# Patient Record
Sex: Female | Born: 1965 | Race: White | Hispanic: No | Marital: Married | Smoking: Former smoker
Health system: Southern US, Community
[De-identification: ages and names within clinical notes are randomized; demographics above are authoritative.]

## PROBLEM LIST (undated history)

## (undated) DIAGNOSIS — I341 Nonrheumatic mitral (valve) prolapse: Secondary | ICD-10-CM

## (undated) DIAGNOSIS — K219 Gastro-esophageal reflux disease without esophagitis: Secondary | ICD-10-CM

## (undated) DIAGNOSIS — J45909 Unspecified asthma, uncomplicated: Secondary | ICD-10-CM

## (undated) DIAGNOSIS — L309 Dermatitis, unspecified: Secondary | ICD-10-CM

## (undated) DIAGNOSIS — E039 Hypothyroidism, unspecified: Secondary | ICD-10-CM

## (undated) DIAGNOSIS — T4145XA Adverse effect of unspecified anesthetic, initial encounter: Secondary | ICD-10-CM

## (undated) DIAGNOSIS — T8859XA Other complications of anesthesia, initial encounter: Secondary | ICD-10-CM

## (undated) DIAGNOSIS — C50919 Malignant neoplasm of unspecified site of unspecified female breast: Secondary | ICD-10-CM

## (undated) DIAGNOSIS — F419 Anxiety disorder, unspecified: Secondary | ICD-10-CM

## (undated) DIAGNOSIS — M1990 Unspecified osteoarthritis, unspecified site: Secondary | ICD-10-CM

## (undated) HISTORY — PX: WISDOM TOOTH EXTRACTION: SHX21

## (undated) HISTORY — DX: Unspecified asthma, uncomplicated: J45.909

## (undated) HISTORY — DX: Dermatitis, unspecified: L30.9

## (undated) HISTORY — PX: COLONOSCOPY: SHX174

---

## 1999-08-23 ENCOUNTER — Encounter: Payer: Self-pay | Admitting: Emergency Medicine

## 1999-08-23 ENCOUNTER — Emergency Department (HOSPITAL_COMMUNITY): Admission: EM | Admit: 1999-08-23 | Discharge: 1999-08-23 | Payer: Self-pay | Admitting: Emergency Medicine

## 2016-09-08 ENCOUNTER — Other Ambulatory Visit: Payer: Self-pay | Admitting: Obstetrics and Gynecology

## 2016-09-08 ENCOUNTER — Other Ambulatory Visit: Payer: Self-pay | Admitting: General Practice

## 2016-09-08 DIAGNOSIS — N63 Unspecified lump in unspecified breast: Secondary | ICD-10-CM

## 2016-09-14 ENCOUNTER — Other Ambulatory Visit: Payer: Self-pay | Admitting: Obstetrics and Gynecology

## 2016-09-14 DIAGNOSIS — N63 Unspecified lump in unspecified breast: Secondary | ICD-10-CM

## 2016-09-15 ENCOUNTER — Ambulatory Visit
Admission: RE | Admit: 2016-09-15 | Discharge: 2016-09-15 | Disposition: A | Discharge status: Home / Self Care | Payer: Managed Care, Other (non HMO) | Source: Ambulatory Visit | Attending: Obstetrics and Gynecology | Admitting: Obstetrics and Gynecology

## 2016-09-15 ENCOUNTER — Ambulatory Visit
Admission: RE | Admit: 2016-09-15 | Discharge: 2016-09-15 | Disposition: A | Discharge status: Home / Self Care | Payer: Managed Care, Other (non HMO) | Source: Ambulatory Visit | Attending: Family Medicine | Admitting: Family Medicine

## 2016-09-15 DIAGNOSIS — C50919 Malignant neoplasm of unspecified site of unspecified female breast: Secondary | ICD-10-CM

## 2016-09-15 DIAGNOSIS — N63 Unspecified lump in unspecified breast: Secondary | ICD-10-CM

## 2016-09-15 HISTORY — DX: Malignant neoplasm of unspecified site of unspecified female breast: C50.919

## 2016-09-30 ENCOUNTER — Telehealth: Payer: Self-pay | Admitting: Hematology and Oncology

## 2016-09-30 ENCOUNTER — Other Ambulatory Visit: Payer: Self-pay | Admitting: General Surgery

## 2016-09-30 ENCOUNTER — Encounter: Payer: Self-pay | Admitting: Hematology and Oncology

## 2016-09-30 DIAGNOSIS — R928 Other abnormal and inconclusive findings on diagnostic imaging of breast: Secondary | ICD-10-CM

## 2016-09-30 NOTE — Telephone Encounter (Signed)
Appt scheduled w/[facility] for 11/27 at 345pm. Pt aware to arrive 30 minutes early. Demographics verified. Letter mailed to the pt.

## 2016-10-18 ENCOUNTER — Ambulatory Visit (HOSPITAL_BASED_OUTPATIENT_CLINIC_OR_DEPARTMENT_OTHER): Payer: Managed Care, Other (non HMO) | Admitting: Hematology and Oncology

## 2016-10-18 ENCOUNTER — Telehealth: Payer: Self-pay | Admitting: Hematology and Oncology

## 2016-10-18 ENCOUNTER — Encounter: Payer: Self-pay | Admitting: Hematology and Oncology

## 2016-10-18 DIAGNOSIS — N6091 Unspecified benign mammary dysplasia of right breast: Secondary | ICD-10-CM | POA: Diagnosis not present

## 2016-10-18 MED ORDER — TAMOXIFEN CITRATE 20 MG PO TABS: 20.0000 mg | ORAL_TABLET | Freq: Every day | ORAL | 3 refills | Status: DC

## 2016-10-18 NOTE — Progress Notes (Signed)
[facility] CONSULT NOTE  Patient Care Team: Drake Leach, MD as PCP - General (Internal Medicine)  CHIEF COMPLAINTS/PURPOSE OF CONSULTATION:  Newly diagnosed atypical lobular hyperplasia  HISTORY OF PRESENTING ILLNESS:  Patty Thompson 50 y.o. female is here because of recent diagnosis of right breast atypical lobular hyperplasia involving a complex sclerosing lesion. Patient had a mammogram at [facility] that detected an abnormality under 3-D views. She was then referred to the breast center where she had a biopsy under 3-D guidance. Pathology returned back as atypical lobular hyperplasia involving a complex sclerosing lesion. She was seen by Dr. Barry Dienes who is scheduling her for a lumpectomy surgery. She is here today to discuss treatment options to reduce the risk of breast cancer.  I reviewed her records extensively and collaborated the history with the patient.  In terms of breast cancer risk profile:  She menarched at early age of 50 and is perimenopausal   She had 2 pregnancy, her first child was born at age 50  She was never exposed to fertility medications or hormone replacement therapy.  She has  family history of Breast/GYN/GI cancer Her first cousin was diagnosed breast cancer age 43  MEDICAL HISTORY: Hypothyroidism, diverticulitis, tendinitis, insomnia, depression   SURGICAL HISTORY:None   SOCIAL HISTORY: Denies any tobacco or recreational drug use. Occasional alcohol use maybe less than once a month.  FAMILY HISTORY:Paternal cousin breast cancer age 49, great aunt on the maternal side unknown cancer   ALLERGIES: Sulfa causes hives swelling and fever  MEDICATIONS:  Current Outpatient Prescriptions  Medication Sig Dispense Refill  . tamoxifen (NOLVADEX) 20 MG tablet Take 1 tablet (20 mg total) by mouth daily. 90 tablet 3   No current facility-administered medications for this visit.   Levothyroxine 125 mg daily, Paxil 10 mg daily, clonazepam as needed  at bedtime, Zyrtec as needed daily  REVIEW OF SYSTEMS:   Constitutional: Denies fevers, chills or abnormal night sweats Eyes: Denies blurriness of vision, double vision or watery eyes Ears, nose, mouth, throat, and face: Denies mucositis or sore throat Respiratory: Denies cough, dyspnea or wheezes Cardiovascular: Denies palpitation, chest discomfort or lower extremity swelling Gastrointestinal:  Denies nausea, heartburn or change in bowel habits Skin: Denies abnormal skin rashes Lymphatics: Denies new lymphadenopathy or easy bruising Neurological:Denies numbness, tingling or new weaknesses Behavioral/Psych: Mood is stable, no new changes  Breast:  Denies any palpable lumps or discharge All other systems were reviewed with the patient and are negative.  PHYSICAL EXAMINATION: ECOG PERFORMANCE STATUS: 0 - Asymptomatic  Vitals:   10/18/16 1553  BP: (!) 126/46  Pulse: (!) 59  Resp: 14  Temp: 97.9 F (36.6 C)   Filed Weights   10/18/16 1553  Weight: 159 lb (72.1 kg)    GENERAL:alert, no distress and comfortable SKIN: skin color, texture, turgor are normal, no rashes or significant lesions EYES: normal, conjunctiva are pink and non-injected, sclera clear OROPHARYNX:no exudate, no erythema and lips, buccal mucosa, and tongue normal  NECK: supple, thyroid normal size, non-tender, without nodularity LYMPH:  no palpable lymphadenopathy in the cervical, axillary or inguinal LUNGS: clear to auscultation and percussion with normal breathing effort HEART: regular rate & rhythm and no murmurs and no lower extremity edema ABDOMEN:abdomen soft, non-tender and normal bowel sounds Musculoskeletal:no cyanosis of digits and no clubbing  PSYCH: alert & oriented x 3 with fluent speech NEURO: no focal motor/sensory deficits BREAST: No palpable nodules in breast. No palpable axillary or supraclavicular lymphadenopathy (exam performed in  the presence of a chaperone)   RADIOGRAPHIC STUDIES: I have  personally reviewed the radiological reports and agreed with the findings in the report.  ASSESSMENT AND PLAN:  Atypical lobular hyperplasia (ALH) of right breast Right breast biopsy upper inner quadrant 09/15/2016: Atypical lobular hyperplasia involving a complex sclerosing lesion  Pathology review: ALH confers a substantially increased risk of subsequent breast cancer. Studies revealed that the relative risk can be 3.72 0.3 times an average woman. It can increase risk of both seem side as well as contralateral breast cancer.  Risk reduction strategies with ALH: 1. Continued surveillance with annual mammograms and breast exams 2. women with [place] should not take oral contraceptives and should also avoid hormone replacement therapy 3. Lifestyle and dietary changes to help stay active and lose weight. Diet containing less red meat would also be recommended. 4. Primary prevention with tamoxifen may be considered for breast cancer risk reduction   Prognosis: Based on [place], patient's lifestyle risk of breast cancer is 26% while an average risk is 11.4%  Patient's 5 year risk of developing breast cancer is 3.2% but an average risk is 1.2%  Tamoxifen counseling:We discussed the risks and benefits of tamoxifen. These include but not limited to insomnia, hot flashes, mood changes, vaginal dryness, and weight gain. Although rare, serious side effects including endometrial cancer, risk of blood clots were also discussed. We strongly believe that the benefits far outweigh the risks. Patient understands these risks and consented to starting treatment. Planned treatment duration is 5 years.   I discussed with the patient what interaction between Paxil and Wellbutrin with tamoxifen. Patient is planning on tapering and discontinuing Paxil and Wellbutrin. She will discuss with her primary care physician if Effexor XR 37.5 mg is an appropriate substitution. Patient would like to start the medication today.  She is likely to have breast surgery in January with Dr. Barry Dienes.  Return to clinic in 3 months for toxicity check and follow-up. She is tolerating it very well we can see her once every 6 months for a year and then annually thereafter.  All questions were answered. The patient knows to call the clinic with any problems, questions or concerns.    Rulon Eisenmenger, MD 10/18/16

## 2016-10-18 NOTE — Assessment & Plan Note (Signed)
Right breast biopsy upper inner quadrant 09/15/2016: Atypical lobular hyperplasia involving a complex sclerosing lesion  Pathology review: ALH confers a substantially increased risk of subsequent breast cancer. Studies revealed that the relative risk can be 3.72 0.3 times an average woman. It can increase risk of both seem side as well as contralateral breast cancer.  Risk reduction strategies with ALH: 1. Continued surveillance with annual mammograms and breast exams 2. women with [place] should not take oral contraceptives and should also avoid hormone replacement therapy 3. Lifestyle and dietary changes to help stay active and lose weight. Diet containing less red meat would also be recommended. 4. Primary prevention with tamoxifen may be considered for breast cancer risk reduction   Prognosis: Based on [place], patient's lifestyle risk of breast cancer is  Patient's 5 year risk of developing breast cancer is

## 2016-10-18 NOTE — Telephone Encounter (Signed)
Gave patient avs report and appointments for February.  °

## 2016-10-19 NOTE — Progress Notes (Signed)
Location of Breast Cancer: Right Breast  Upper Inner Quadrant  Histology per Pathology Report: Diagnosis 09/15/2016: Breast, right, needle core biopsy, upper inner quadrant - LOBULAR NEOPLASIA (ATYPICAL LOBULAR HYPERPLASIA) INVOLVING A COMPLEX SCLEROSING LESION  Receptor Status: ER(), PR (), Her2-neu (), Ki-()  Did patient present with symptoms (if so, please note symptoms) or was this found on screening mammography?:   Past/Anticipated interventions by surgeon, if any:Dr. Stark Klein, MD11/05/2016, seen, surgery scheduled 11/25/2016  Past/Anticipated interventions by medical oncology, if any: Chemotherapy : Dr. Lindi Adie 10/18/16, tamoxifen ,   3 months,toxicity check and follow up 01/17/17  Lymphedema issues, if any:  No  Pain issues, if any:   Soreness  Right breast where biopsy done  SAFETY ISSUES:  Prior radiation? NO  Pacemaker/ICD? NO  Possible current pregnancy? NO  Is the patient on methotrexate?  NO  Current Complaints / other details:   Menarche age 52,G2P2 1st live birth age 83, NO HRT, no tobacco use, occasional alcohol 1st  Paternal cousin dx breast cancer age 10, Maternal  Great aunt  Believe breast cancer  BP (!) 88/54 (BP Location: Left Arm, Patient Position: Sitting, Cuff Size: Normal)   Pulse 64   Temp 98.1 F (36.7 C)   Wt Readings from Last 3 Encounters:  10/18/16 159 lb (72.1 kg)    Allergies: Sulfa =hives and fever    Rebecca Eaton, RN 10/19/2016,9:43 AM

## 2016-10-19 NOTE — Progress Notes (Signed)
Location of Breast Cancer: Right Breast  Upper Inner Quadrant  Histology per Pathology Report: Diagnosis 09/15/2016: Breast, right, needle core biopsy, upper inner quadrant - LOBULAR NEOPLASIA (ATYPICAL LOBULAR HYPERPLASIA) INVOLVING A COMPLEX SCLEROSING LESION  Receptor Status: ER(), PR (), Her2-neu (), Ki-()  Did patient present with symptoms (if so, please note symptoms) or was this found on screening mammography?:   Past/Anticipated interventions by surgeon, if any: Dr. Barry Dienes  Has scheduled lumpectomy 11/25/2016  Past/Anticipated interventions by medical oncology, if any: Chemotherapy : Dr. Lindi Adie 10/18/16   Lymphedema issues, if any:    Pain issues, if any:    SAFETY ISSUES:  Prior radiation? NO  Pacemaker/ICD? NO  Possible current pregnancy?  Is the patient on methotrexate?  NO  Current Complaints / other details:   Menarche age 59,G2P2 1st live birth age 22, NO HRT, no tobacco use, occasional alcohol 1st cousin dx breast cancer age 4,  Allergies: Sulfa =hives and fever, tape    [name], Felicita Gage, RN 10/19/2016,10:32 AM

## 2016-10-21 ENCOUNTER — Ambulatory Visit
Admission: RE | Admit: 2016-10-21 | Discharge: 2016-10-21 | Disposition: A | Discharge status: Home / Self Care | Payer: Managed Care, Other (non HMO) | Source: Ambulatory Visit | Attending: Radiation Oncology | Admitting: Radiation Oncology

## 2016-10-21 ENCOUNTER — Encounter: Payer: Self-pay | Admitting: Radiation Oncology

## 2016-10-21 VITALS — BP 88/54 | HR 64 | Temp 98.1°F

## 2016-10-21 DIAGNOSIS — Z79899 Other long term (current) drug therapy: Secondary | ICD-10-CM | POA: Diagnosis not present

## 2016-10-21 DIAGNOSIS — Z882 Allergy status to sulfonamides status: Secondary | ICD-10-CM | POA: Diagnosis not present

## 2016-10-21 DIAGNOSIS — C50211 Malignant neoplasm of upper-inner quadrant of right female breast: Secondary | ICD-10-CM

## 2016-10-21 DIAGNOSIS — Z79810 Long term (current) use of selective estrogen receptor modulators (SERMs): Secondary | ICD-10-CM | POA: Diagnosis not present

## 2016-10-21 DIAGNOSIS — N6081 Other benign mammary dysplasias of right breast: Secondary | ICD-10-CM | POA: Insufficient documentation

## 2016-10-21 DIAGNOSIS — N6091 Unspecified benign mammary dysplasia of right breast: Secondary | ICD-10-CM

## 2016-10-21 HISTORY — DX: Malignant neoplasm of unspecified site of unspecified female breast: C50.919

## 2016-10-21 NOTE — Progress Notes (Signed)
Radiation Oncology         [phone] ________________________________  Name: Patty Thompson MRN: [id number]  Date: 10/21/2016  DOB: 09-Jul-1966  [id number], Meda Coffee, MD  Stark Klein, MD     REFERRING PHYSICIAN: Stark Klein, MD   DIAGNOSIS: The primary encounter diagnosis was Malignant neoplasm of upper-inner quadrant of right female breast, unspecified estrogen receptor status ([place]). A diagnosis of Atypical lobular hyperplasia [facility]) of right breast was also pertinent to this visit.  CHIEF COMPLAINT: Recent diagnosis of atypical lobular neoplasia involving a complex sclerosing lesion of the right breast  HISTORY OF PRESENT ILLNESS: Patty Thompson is a 50 y.o. female who had a screening mammogram on 08/30/16 in [place], [place] that detected a possible distortion in the right breast.  Diagnostic mammogram of the right breast on 09/08/16 noted a persistent architectural distortion with probable associated microcalcifications in the slightly medial posterior right breast.  Biopsy of the UIQ of the right breast on 09/15/16 revealed lobular neoplasia (atypical lobular hyperplasia) involving a complex sclerosing lesion.  The patient consulted with Dr. Barry Dienes on 09/28/16 regarding surgery. The patient then consulted with Dr. Lindi Adie on 10/18/16 who discussed that she has a higher risk of developing breast cancer. He recommended starting neoadjuvant Tamoxifen and surgery at a later date. The patient presents today to discuss the role of radiation in the management of her disease.  PREVIOUS RADIATION THERAPY: No  PAST MEDICAL HISTORY:  Past Medical History:  Diagnosis Date  . Breast cancer ([place]) 09/15/2016   Right breast upper inner       PAST SURGICAL HISTORY:No past surgical history on file.   FAMILY HISTORY: No family history on file.   SOCIAL HISTORY:  reports that she has never smoked. She has never used smokeless tobacco.   ALLERGIES: Sulfa antibiotics and  Tape   MEDICATIONS:  Current Outpatient Prescriptions  Medication Sig Dispense Refill  . tamoxifen (NOLVADEX) 20 MG tablet Take 1 tablet (20 mg total) by mouth daily. 90 tablet 3  . cetirizine (ZYRTEC) 10 MG tablet Take 10 mg by mouth daily.    . Levothyroxine Sodium (SYNTHROID PO) Take 135 mcg by mouth daily.    Marland Kitchen LORazepam (ATIVAN) 0.5 MG tablet Take 0.5 mg by mouth at bedtime as needed for anxiety.    Marland Kitchen PARoxetine (PAXIL) 10 MG tablet Take 10 mg by mouth daily.     No current facility-administered medications for this encounter.      REVIEW OF SYSTEMS: Negative other than As discussed in the history of present illness     PHYSICAL EXAM:  Wt Readings from Last 3 Encounters:  10/18/16 159 lb (72.1 kg)   Temp Readings from Last 3 Encounters:  10/21/16 98.1 F (36.7 C)  10/18/16 97.9 F (36.6 C) (Oral)   BP Readings from Last 3 Encounters:  10/21/16 (!) 88/54  10/18/16 (!) 126/46   Pulse Readings from Last 3 Encounters:  10/21/16 64  10/18/16 (!) 59      ECOG = 1  0 - Asymptomatic (Fully active, able to carry on all predisease activities without restriction)  1 - Symptomatic but completely ambulatory (Restricted in physically strenuous activity but ambulatory and able to carry out work of a light or sedentary nature. For example, light housework, office work)  2 - Symptomatic, <50% in bed during the day (Ambulatory and capable of all self care but unable to carry out any work activities. Up and about more than 50% of waking hours)  3 -  Symptomatic, >50% in bed, but not bedbound (Capable of only limited self-care, confined to bed or chair 50% or more of waking hours)  4 - Bedbound (Completely disabled. Cannot carry on any self-care. Totally confined to bed or chair)  5 - Death   Eustace Pen MM, Creech RH, Tormey DC, et al. [phone]). "Toxicity and response criteria of the [facility]". [place] Oncol. 5 (6): 649-55    LABORATORY DATA:  No  results found for: WBC, HGB, HCT, MCV, PLT No results found for: NA, K, CL, CO2 No results found for: ALT, AST, GGT, ALKPHOS, BILITOT    RADIOGRAPHY: No results found.     IMPRESSION: Lobular neoplasia involving a complex sclerosing lesion.  In regards to her current diagnosis, surgery and hormonal therapy alone is adequate in terms of treatment. If by chance final pathology reveals additional findings, then radiation might be warranted.  Even though radiation might not be required, we discussed the role of radiation in decreasing local failures in patients who undergo lumpectomy in the setting of breast cancer. We discussed the process of CT simulation and the placement tattoos. We discussed 4-6 weeks of treatment as an outpatient. We discussed the possibility of asymptomatic lung damage. We discussed the low likelihood of secondary malignancies. We discussed the possible side effects including but not limited to skin redness, fatigue, permanent skin darkening, and breast swelling.  PLAN: The patient is scheduled for a right lumpectomy by Dr. Barry Dienes in January 2018. She is also scheduled to follow up with Dr. Lindi Adie in regards to her Tamoxifen in February 2018. The patient will see me on a PRN basis.   The patient was seen today for 20 minutes, with the majority of the time spent counseling the patient on his diagnosis of cancer and coordinating his care.  ------------------------------------------------  Jodelle Gross, MD, PhD  This document serves as a record of services personally performed by Kyung Rudd, MD. It was created on his behalf by Darcus Austin, a trained medical scribe. The creation of this record is based on the scribe's personal observations and the provider's statements to them. This document has been checked and approved by the attending provider.

## 2016-11-18 NOTE — Pre-Procedure Instructions (Signed)
    ASUSENA BOGGESS  11/18/2016      [id number] - [place], [place] - [address]. AT [address] [place] [address] Phone: [phone] Fax: [phone]    Your procedure is scheduled on Thursday, January 4.  Report to [facility] Admitting at 5:30 AM               Your surgery or procedure is scheduled for 7:30 AM   Call this number if you have problems the morning of surgery: [phone]               For any other questions, please call [phone], Monday - Friday 8 AM - 4 PM.    Remember:  Do not eat food or drink liquids after midnight Wednesday, January 3.  Take these medicines the morning of surgery with A SIP OF WATER: levothyroxine (SYNTHROID, LEVOTHROID), tamoxifen (NOLVADEX) .                     Take if needed: acetaminophen (TYLENOL).                Stop taking Aspirin, Aspirin Products herbal medications.  Do not take any NSAIDS ie:  Ibuprofen, Advil, Naproxen.   Do not wear jewelry, make-up or nail polish.  Do not wear lotions, powders, or perfumes, or deodorant.  Do not shave 48 hours prior to surgery.   Do not bring valuables to the hospital.  [facility] is not responsible for any belongings or valuables.  Contacts, dentures or bridgework may not be worn into surgery.  Leave your suitcase in the car.  After surgery it may be brought to your room.  For patients admitted to the hospital, discharge time will be determined by your treatment team.  Patients discharged the day of surgery will not be allowed to drive home.   Name and phone number of your driver:  -  Special instructions: Review  [facility] - Preparing For Surgery.  Please read over the following fact sheets that you were given: Review  [facility] - Preparing For Surgery, Coughing and Deep Breathing and Pain Booklet

## 2016-11-19 ENCOUNTER — Encounter (HOSPITAL_COMMUNITY)
Admission: RE | Admit: 2016-11-19 | Discharge: 2016-11-19 | Disposition: A | Discharge status: Home / Self Care | Payer: Managed Care, Other (non HMO) | Source: Ambulatory Visit | Attending: General Surgery | Admitting: General Surgery

## 2016-11-19 ENCOUNTER — Encounter (HOSPITAL_COMMUNITY): Payer: Self-pay

## 2016-11-19 DIAGNOSIS — Z01812 Encounter for preprocedural laboratory examination: Secondary | ICD-10-CM | POA: Diagnosis present

## 2016-11-19 HISTORY — DX: Adverse effect of unspecified anesthetic, initial encounter: T41.45XA

## 2016-11-19 HISTORY — DX: Hypothyroidism, unspecified: E03.9

## 2016-11-19 HISTORY — DX: Nonrheumatic mitral (valve) prolapse: I34.1

## 2016-11-19 HISTORY — DX: Unspecified osteoarthritis, unspecified site: M19.90

## 2016-11-19 HISTORY — DX: Anxiety disorder, unspecified: F41.9

## 2016-11-19 HISTORY — DX: Other complications of anesthesia, initial encounter: T88.59XA

## 2016-11-19 HISTORY — DX: Gastro-esophageal reflux disease without esophagitis: K21.9

## 2016-11-19 LAB — HCG, SERUM, QUALITATIVE: PREG SERUM: NEGATIVE

## 2016-11-19 LAB — BASIC METABOLIC PANEL
ANION GAP: 10 (ref 5–15)
BUN: 13 mg/dL (ref 6–20)
CALCIUM: 9.5 mg/dL (ref 8.9–10.3)
CO2: 25 mmol/L (ref 22–32)
CREATININE: 0.64 mg/dL (ref 0.44–1.00)
Chloride: 104 mmol/L (ref 101–111)
Glucose, Bld: 65 mg/dL (ref 65–99)
Potassium: 4 mmol/L (ref 3.5–5.1)
SODIUM: 139 mmol/L (ref 135–145)

## 2016-11-19 LAB — CBC
HCT: 43.1 % (ref 36.0–46.0)
HEMOGLOBIN: 14.1 g/dL (ref 12.0–15.0)
MCH: 27.3 pg (ref 26.0–34.0)
MCHC: 32.7 g/dL (ref 30.0–36.0)
MCV: 83.4 fL (ref 78.0–100.0)
PLATELETS: 235 10*3/uL (ref 150–400)
RBC: 5.17 MIL/uL — AB (ref 3.87–5.11)
RDW: 11.8 % (ref 11.5–15.5)
WBC: 5.4 10*3/uL (ref 4.0–10.5)

## 2016-11-19 NOTE — Progress Notes (Signed)
PCP: Dr. Lebron Quam @[facility] [place]--will request notes/any cardiac studies  Pt. States she has not had any cardiac studies, except stress test 20 yrs. Ago---normal  Denies seeing a cardiologist

## 2016-11-24 ENCOUNTER — Ambulatory Visit
Admission: RE | Admit: 2016-11-24 | Discharge: 2016-11-24 | Disposition: A | Discharge status: Home / Self Care | Payer: Managed Care, Other (non HMO) | Source: Ambulatory Visit | Attending: General Surgery | Admitting: General Surgery

## 2016-11-24 DIAGNOSIS — R928 Other abnormal and inconclusive findings on diagnostic imaging of breast: Secondary | ICD-10-CM

## 2016-11-24 NOTE — H&P (Addendum)
Patty Thompson Jefm Petty  Location: [facility] Patient #: [id number] DOB: [birth year] Married / Language: English / Race: White Female   History of Present Illness The patient is a 51 year old female who presents with a complaint of Breast problems. The patient is a 51 year old female referred by Dr. Glennon Mac for consultation with a new diagnosis of abnormal right mammogram. The patient had an area of asymmetry and distortion that was being followed. When this did not resolve, she was recommended to receive a core needle biopsy. Core needle biopsy showed atypical lobular hyperplasia with a complex sclerosing lesion. She was referred for excisional biopsy. The patient does not have for history of breast complaints. She does have a first cousin who had breast cancer. Her diagnostic mammogram is not available as it was performed at [facility]. However reports are reviewed for biopsy and post clip mammogram.  mammogram ADDENDUM: Pathology revealed LOBULAR NEOPLASIA (ATYPICAL LOBULAR HYPERPLASIA) INVOLVING A COMPLEX SCLEROSING LESION of the Right breast, upper inner quadrant. This was found to be concordant by Dr. Luberta Robertson, with excision recommended. Pathology results were discussed with the patient by telephone. The patient reported doing well after the biopsy with tenderness at the site. Post biopsy instructions and care were reviewed and questions were answered. The patient was encouraged to call [facility] for any additional concerns. Surgical consultation has been arranged with Dr. Stark Klein at [facility], per patient request, on September 28, 2016. The coil shaped clip is located approximately 3 cm inferior to the  area of distortion in the lateral projection. Repeat tomosynthesis guided clip placement with seed localization will be needed prior to surgery.   Other Problems Anxiety Disorder  Arthritis  Back Pain   Diverticulosis  Gastroesophageal Reflux Disease  Lump In Breast  Migraine Headache  Thyroid Disease   Past Surgical History Breast Biopsy  Right. Oral Surgery   Diagnostic Studies History Colonoscopy  1-5 years ago Mammogram  within last year Pap Smear  1-5 years ago  Allergies Sulfa Antibiotics  Swelling, Rash.  Medication History Levothyroxine Sodium (137MCG Tablet, Oral daily) Active. Paxil (10MG  Tablet, Oral daily) Active. ClonazePAM (0.5MG  Tablet, 1/4 tablet Oral as needed) Active. ZyrTEC Allergy (10MG  Tablet, Oral daily) Active. Medications Reconciled  Social History Alcohol use  Occasional alcohol use. Caffeine use  Coffee. No drug use  Tobacco use  Former smoker.  Family History Alcohol Abuse  Father, Mother, Sister. Depression  Father, Mother. Diabetes Mellitus  Father. Heart Disease  Father. Hypertension  Father. Respiratory Condition  Son. Thyroid problems  Mother.  Pregnancy / Birth History Gravida  2 Irregular periods  Length (months) of breastfeeding  7-12 Maternal age  64-35 Para  2    Review of Systems General Present- Fatigue. Not Present- Appetite Loss, Chills, Fever, Night Sweats, Weight Gain and Weight Loss. Skin Present- Rash. Not Present- Change in Wart/Mole, Dryness, Hives, Jaundice, New Lesions, Non-Healing Wounds and Ulcer. HEENT Present- Hearing Loss, Ringing in the Ears, Seasonal Allergies, Visual Disturbances and Wears glasses/contact lenses. Not Present- Earache, Hoarseness, Nose Bleed, Oral Ulcers, Sinus Pain, Sore Throat and Yellow Eyes. Breast Present- Breast Mass, Breast Pain and Skin Changes. Not Present- Nipple Discharge. Cardiovascular Present- Swelling of Extremities. Not Present- Chest Pain, Difficulty Breathing Lying Down, Leg Cramps, Palpitations, Rapid Heart Rate and Shortness of Breath. Gastrointestinal Present- Abdominal Pain. Not Present- Bloating, Bloody Stool, Change in Bowel  Habits, Chronic diarrhea, Constipation, Difficulty Swallowing, Excessive gas, Gets full quickly at meals,  Hemorrhoids, Indigestion, Nausea, Rectal Pain and Vomiting. Female Genitourinary Not Present- Frequency, Nocturia, Painful Urination, Pelvic Pain and Urgency. Musculoskeletal Present- Back Pain, Joint Pain and Joint Stiffness. Not Present- Muscle Pain, Muscle Weakness and Swelling of Extremities. Neurological Present- Decreased Memory. Not Present- Fainting, Headaches, Numbness, Seizures, Tingling, Tremor, Trouble walking and Weakness. Psychiatric Present- Anxiety. Not Present- Bipolar, Change in Sleep Pattern, Depression, Fearful and Frequent crying. Endocrine Present- Hair Changes and Hot flashes. Not Present- Cold Intolerance, Excessive Hunger, Heat Intolerance and New Diabetes. Hematology Not Present- Blood Thinners, Easy Bruising, Excessive bleeding, Gland problems, HIV and Persistent Infections.  Vitals  Weight: 157 lb Height: 66.5in Height was reported by patient. Body Surface Area: 1.81 m Body Mass Index: 24.96 kg/m  Temp.: 98.1F  Pulse: 59 (Regular)  BP: 122/62 (Sitting, Left Arm, Standard)    Physical Exam General Mental Status-Alert. General Appearance-Consistent with stated age. Hydration-Well hydrated. Voice-Normal.  Head and Neck Head-normocephalic, atraumatic with no lesions or palpable masses. Trachea-midline. Thyroid Gland Characteristics - normal size and consistency.  Eye Eyeball - Bilateral-Extraocular movements intact. Sclera/Conjunctiva - Bilateral-No scleral icterus.  Chest and Lung Exam Chest and lung exam reveals -quiet, even and easy respiratory effort with no use of accessory muscles and on auscultation, normal breath sounds, no adventitious sounds and normal vocal resonance. Inspection Chest Wall - Normal. Back - normal.  Breast Note: No palpable masses. Some bruising on the upper right breast at the biopsy  site. No nipple retraction or skin dimpling. No nipple discharge is present. no axillary LAD.   Cardiovascular Cardiovascular examination reveals -normal heart sounds, regular rate and rhythm with no murmurs and normal pedal pulses bilaterally.  Abdomen Inspection Inspection of the abdomen reveals - No Hernias. Palpation/Percussion Palpation and Percussion of the abdomen reveal - Soft, Non Tender, No Rebound tenderness, No Rigidity (guarding) and No hepatosplenomegaly. Auscultation Auscultation of the abdomen reveals - Bowel sounds normal.  Neurologic Neurologic evaluation reveals -alert and oriented x 3 with no impairment of recent or remote memory. Mental Status-Normal.  Musculoskeletal Global Assessment -Note: no gross deformities.  Normal Exam - Left-Upper Extremity Strength Normal and Lower Extremity Strength Normal. Normal Exam - Right-Upper Extremity Strength Normal and Lower Extremity Strength Normal.  Lymphatic Head & Neck  General Head & Neck Lymphatics: Bilateral - Description - Normal. Axillary  General Axillary Region: Bilateral - Description - Normal. Tenderness - Non Tender. Femoral & Inguinal  Generalized Femoral & Inguinal Lymphatics: Bilateral - Description - No Generalized lymphadenopathy.    Assessment & Plan ABNORMAL MAMMOGRAM OF RIGHT BREAST (R92.8) Impression: The patient has a biopsy of atypical lobular hyperplasia on core needle biopsy. This is at risk for being adjacent to malignancy. We will perform a CT localized excisional biopsy on this area to make sure that there is not a head and cancer. I'll refer her to oncology for discussion of prophylactic tamoxifen.  I reviewed the risks of surgery as well as the process. I discussed bleeding, infection, seroma, damage to adjacent structures, chronic pain, possible cancer as a diagnosis, possible need for additional surgeries or procedures. Patient understands and wishes to proceed.  The  surgical procedure was described to the patient. I discussed the incision type and location and that we would need radiology involved on with a wire or seed marker and/or sentinel node.  The risks and benefits of the procedure were described to the patient and she wishes to proceed. Current Plans You are being scheduled for surgery - Our schedulers will call you.  You should hear from our office's scheduling department within 5 working days about the location, date, and time of surgery. We try to make accommodations for patient's preferences in scheduling surgery, but sometimes the OR schedule or the surgeon's schedule prevents [place] from making those accommodations.  If you have not heard from our office [phone]) in 5 working days, call the office and ask for your surgeon's nurse.  If you have other questions about your diagnosis, plan, or surgery, call the office and ask for your surgeon's nurse.  Pt Education - CSS Breast Biopsy Instructions (FLB): discussed with patient and provided information.   Signed by Stark Klein, MD

## 2016-11-25 ENCOUNTER — Encounter (HOSPITAL_COMMUNITY): Payer: Self-pay | Admitting: *Deleted

## 2016-11-25 ENCOUNTER — Ambulatory Visit (HOSPITAL_COMMUNITY): Payer: Managed Care, Other (non HMO) | Admitting: Anesthesiology

## 2016-11-25 ENCOUNTER — Ambulatory Visit (HOSPITAL_COMMUNITY)
Admission: RE | Admit: 2016-11-25 | Discharge: 2016-11-25 | Disposition: A | Discharge status: Home / Self Care | Payer: Managed Care, Other (non HMO) | Source: Ambulatory Visit | Attending: General Surgery | Admitting: General Surgery

## 2016-11-25 ENCOUNTER — Encounter (HOSPITAL_COMMUNITY)
Admission: RE | Disposition: A | Discharge status: Home / Self Care | Payer: Self-pay | Source: Ambulatory Visit | Attending: General Surgery

## 2016-11-25 ENCOUNTER — Ambulatory Visit
Admission: RE | Admit: 2016-11-25 | Discharge: 2016-11-25 | Disposition: A | Discharge status: Home / Self Care | Payer: Managed Care, Other (non HMO) | Source: Ambulatory Visit | Attending: General Surgery | Admitting: General Surgery

## 2016-11-25 DIAGNOSIS — N6011 Diffuse cystic mastopathy of right breast: Secondary | ICD-10-CM | POA: Insufficient documentation

## 2016-11-25 DIAGNOSIS — F419 Anxiety disorder, unspecified: Secondary | ICD-10-CM | POA: Diagnosis not present

## 2016-11-25 DIAGNOSIS — E039 Hypothyroidism, unspecified: Secondary | ICD-10-CM | POA: Diagnosis not present

## 2016-11-25 DIAGNOSIS — Z8719 Personal history of other diseases of the digestive system: Secondary | ICD-10-CM | POA: Insufficient documentation

## 2016-11-25 DIAGNOSIS — Z87891 Personal history of nicotine dependence: Secondary | ICD-10-CM | POA: Insufficient documentation

## 2016-11-25 DIAGNOSIS — Z882 Allergy status to sulfonamides status: Secondary | ICD-10-CM | POA: Insufficient documentation

## 2016-11-25 DIAGNOSIS — K219 Gastro-esophageal reflux disease without esophagitis: Secondary | ICD-10-CM | POA: Diagnosis not present

## 2016-11-25 DIAGNOSIS — N61 Mastitis without abscess: Secondary | ICD-10-CM | POA: Insufficient documentation

## 2016-11-25 DIAGNOSIS — M1990 Unspecified osteoarthritis, unspecified site: Secondary | ICD-10-CM | POA: Insufficient documentation

## 2016-11-25 DIAGNOSIS — N6021 Fibroadenosis of right breast: Secondary | ICD-10-CM | POA: Insufficient documentation

## 2016-11-25 DIAGNOSIS — R928 Other abnormal and inconclusive findings on diagnostic imaging of breast: Secondary | ICD-10-CM | POA: Diagnosis present

## 2016-11-25 DIAGNOSIS — I341 Nonrheumatic mitral (valve) prolapse: Secondary | ICD-10-CM | POA: Insufficient documentation

## 2016-11-25 HISTORY — PX: BREAST LUMPECTOMY WITH RADIOACTIVE SEED LOCALIZATION: SHX6424

## 2016-11-25 SURGERY — BREAST LUMPECTOMY WITH RADIOACTIVE SEED LOCALIZATION
Anesthesia: General | Site: Breast | Laterality: Right

## 2016-11-25 MED ORDER — PROPOFOL 10 MG/ML IV BOLUS
INTRAVENOUS | Status: AC
  Filled 2016-11-25: qty 40

## 2016-11-25 MED ORDER — CHLORHEXIDINE GLUCONATE CLOTH 2 % EX PADS: 6.0000 | MEDICATED_PAD | Freq: Once | CUTANEOUS | Status: DC

## 2016-11-25 MED ORDER — MIDAZOLAM HCL 5 MG/5ML IJ SOLN
INTRAMUSCULAR | Status: DC | PRN
  Administered 2016-11-25: 2 mg via INTRAVENOUS

## 2016-11-25 MED ORDER — CEFAZOLIN SODIUM-DEXTROSE 2-4 GM/100ML-% IV SOLN
2.0000 g | INTRAVENOUS | Status: AC
  Administered 2016-11-25: 2 g via INTRAVENOUS
  Filled 2016-11-25: qty 100

## 2016-11-25 MED ORDER — LIDOCAINE 2% (20 MG/ML) 5 ML SYRINGE
INTRAMUSCULAR | Status: AC
  Filled 2016-11-25: qty 5

## 2016-11-25 MED ORDER — ACETAMINOPHEN 650 MG RE SUPP
650.0000 mg | RECTAL | Status: DC | PRN
  Filled 2016-11-25: qty 1

## 2016-11-25 MED ORDER — ONDANSETRON HCL 4 MG/2ML IJ SOLN
INTRAMUSCULAR | Status: DC | PRN
  Administered 2016-11-25: 4 mg via INTRAVENOUS

## 2016-11-25 MED ORDER — SCOPOLAMINE 1 MG/3DAYS TD PT72
MEDICATED_PATCH | TRANSDERMAL | Status: DC | PRN
  Administered 2016-11-25: 1 via TRANSDERMAL

## 2016-11-25 MED ORDER — SCOPOLAMINE 1 MG/3DAYS TD PT72
MEDICATED_PATCH | TRANSDERMAL | Status: AC
  Filled 2016-11-25: qty 2

## 2016-11-25 MED ORDER — EPHEDRINE SULFATE 50 MG/ML IJ SOLN
INTRAMUSCULAR | Status: DC | PRN
  Administered 2016-11-25 (×2): 5 mg via INTRAVENOUS

## 2016-11-25 MED ORDER — SODIUM CHLORIDE 0.9% FLUSH: 3.0000 mL | INTRAVENOUS | Status: DC | PRN

## 2016-11-25 MED ORDER — LACTATED RINGERS IV SOLN
INTRAVENOUS | Status: DC | PRN
  Administered 2016-11-25: 07:00:00 via INTRAVENOUS

## 2016-11-25 MED ORDER — SODIUM CHLORIDE 0.9 % IV SOLN: 250.0000 mL | INTRAVENOUS | Status: DC | PRN

## 2016-11-25 MED ORDER — DEXAMETHASONE SODIUM PHOSPHATE 10 MG/ML IJ SOLN
INTRAMUSCULAR | Status: DC | PRN
  Administered 2016-11-25: 10 mg via INTRAVENOUS

## 2016-11-25 MED ORDER — PROMETHAZINE HCL 25 MG/ML IJ SOLN
INTRAMUSCULAR | Status: DC
Start: 2016-11-25 — End: 2016-11-25
  Filled 2016-11-25: qty 1

## 2016-11-25 MED ORDER — EPHEDRINE 5 MG/ML INJ
INTRAVENOUS | Status: AC
Start: 2016-11-25 — End: 2016-11-25
  Filled 2016-11-25: qty 10

## 2016-11-25 MED ORDER — LIDOCAINE HCL (PF) 1 % IJ SOLN
INTRAMUSCULAR | Status: AC
  Filled 2016-11-25: qty 30

## 2016-11-25 MED ORDER — 0.9 % SODIUM CHLORIDE (POUR BTL) OPTIME
TOPICAL | Status: DC | PRN
  Administered 2016-11-25: 1000 mL

## 2016-11-25 MED ORDER — DEXAMETHASONE SODIUM PHOSPHATE 10 MG/ML IJ SOLN
INTRAMUSCULAR | Status: AC
  Filled 2016-11-25: qty 1

## 2016-11-25 MED ORDER — FENTANYL CITRATE (PF) 100 MCG/2ML IJ SOLN: 25.0000 ug | INTRAMUSCULAR | Status: DC | PRN

## 2016-11-25 MED ORDER — FENTANYL CITRATE (PF) 100 MCG/2ML IJ SOLN
INTRAMUSCULAR | Status: DC | PRN
Start: 2016-11-25 — End: 2016-11-25
  Administered 2016-11-25 (×2): 50 ug via INTRAVENOUS

## 2016-11-25 MED ORDER — OXYCODONE HCL 5 MG PO TABS: 5.0000 mg | ORAL_TABLET | Freq: Four times a day (QID) | ORAL | 0 refills | Status: DC | PRN

## 2016-11-25 MED ORDER — ONDANSETRON HCL 4 MG/2ML IJ SOLN
INTRAMUSCULAR | Status: AC
  Filled 2016-11-25: qty 2

## 2016-11-25 MED ORDER — LIDOCAINE 2% (20 MG/ML) 5 ML SYRINGE
INTRAMUSCULAR | Status: DC | PRN
Start: 2016-11-25 — End: 2016-11-25
  Administered 2016-11-25: 100 mg via INTRAVENOUS

## 2016-11-25 MED ORDER — OXYCODONE HCL 5 MG PO TABS: 5.0000 mg | ORAL_TABLET | ORAL | Status: DC | PRN

## 2016-11-25 MED ORDER — ACETAMINOPHEN 325 MG PO TABS
650.0000 mg | ORAL_TABLET | ORAL | Status: DC | PRN
  Administered 2016-11-25: 650 mg via ORAL
  Filled 2016-11-25 (×2): qty 2

## 2016-11-25 MED ORDER — ACETAMINOPHEN 325 MG PO TABS
ORAL_TABLET | ORAL | Status: AC
  Filled 2016-11-25: qty 2

## 2016-11-25 MED ORDER — PROPOFOL 10 MG/ML IV BOLUS
INTRAVENOUS | Status: DC | PRN
  Administered 2016-11-25: 50 mg via INTRAVENOUS
  Administered 2016-11-25: 150 mg via INTRAVENOUS

## 2016-11-25 MED ORDER — FENTANYL CITRATE (PF) 100 MCG/2ML IJ SOLN
INTRAMUSCULAR | Status: AC
  Filled 2016-11-25: qty 4

## 2016-11-25 MED ORDER — PROMETHAZINE HCL 25 MG/ML IJ SOLN
6.2500 mg | INTRAMUSCULAR | Status: DC | PRN
  Administered 2016-11-25: 6.25 mg via INTRAVENOUS

## 2016-11-25 MED ORDER — LIDOCAINE HCL 1 % IJ SOLN
INTRAMUSCULAR | Status: DC | PRN
  Administered 2016-11-25: 07:00:00 via INTRAMUSCULAR

## 2016-11-25 MED ORDER — SODIUM CHLORIDE 0.9% FLUSH: 3.0000 mL | Freq: Two times a day (BID) | INTRAVENOUS | Status: DC

## 2016-11-25 MED ORDER — BUPIVACAINE HCL (PF) 0.25 % IJ SOLN
INTRAMUSCULAR | Status: AC
  Filled 2016-11-25: qty 30

## 2016-11-25 MED ORDER — MIDAZOLAM HCL 2 MG/2ML IJ SOLN
INTRAMUSCULAR | Status: AC
  Filled 2016-11-25: qty 2

## 2016-11-25 SURGICAL SUPPLY — 55 items
BINDER BREAST LRG (GAUZE/BANDAGES/DRESSINGS) ×3 IMPLANT
BINDER BREAST XLRG (GAUZE/BANDAGES/DRESSINGS) IMPLANT
BLADE SURG 10 STRL SS (BLADE) ×3 IMPLANT
CANISTER SUCTION 2500CC (MISCELLANEOUS) ×3 IMPLANT
CHLORAPREP W/TINT 26ML (MISCELLANEOUS) ×3 IMPLANT
CLIP TI LARGE 6 (CLIP) ×3 IMPLANT
CLIP TI MEDIUM 6 (CLIP) ×3 IMPLANT
CLOSURE WOUND 1/2 X4 (GAUZE/BANDAGES/DRESSINGS) ×1
COVER PROBE W GEL 5X96 (DRAPES) ×3 IMPLANT
COVER SURGICAL LIGHT HANDLE (MISCELLANEOUS) ×3 IMPLANT
DECANTER SPIKE VIAL GLASS SM (MISCELLANEOUS) ×3 IMPLANT
DERMABOND ADVANCED (GAUZE/BANDAGES/DRESSINGS) ×2
DERMABOND ADVANCED .7 DNX12 (GAUZE/BANDAGES/DRESSINGS) ×1 IMPLANT
DEVICE DUBIN SPECIMEN MAMMOGRA (MISCELLANEOUS) ×3 IMPLANT
DRAPE CHEST BREAST 15X10 FENES (DRAPES) ×3 IMPLANT
DRAPE UTILITY XL STRL (DRAPES) ×3 IMPLANT
DRSG PAD ABDOMINAL 8X10 ST (GAUZE/BANDAGES/DRESSINGS) ×3 IMPLANT
ELECT CAUTERY BLADE 6.4 (BLADE) ×3 IMPLANT
ELECT REM PT RETURN 9FT ADLT (ELECTROSURGICAL) ×3
ELECTRODE REM PT RTRN 9FT ADLT (ELECTROSURGICAL) ×1 IMPLANT
GLOVE BIO SURGEON STRL SZ 6 (GLOVE) ×3 IMPLANT
GLOVE BIOGEL PI IND STRL 6.5 (GLOVE) ×1 IMPLANT
GLOVE BIOGEL PI IND STRL 7.0 (GLOVE) ×1 IMPLANT
GLOVE BIOGEL PI INDICATOR 6.5 (GLOVE) ×2
GLOVE BIOGEL PI INDICATOR 7.0 (GLOVE) ×2
GLOVE SURG SS PI 6.0 STRL IVOR (GLOVE) ×3 IMPLANT
GLOVE SURG SS PI 7.0 STRL IVOR (GLOVE) ×3 IMPLANT
GLOVE SURG SS PI 7.5 STRL IVOR (GLOVE) ×3 IMPLANT
GOWN STRL REUS W/ TWL LRG LVL3 (GOWN DISPOSABLE) ×1 IMPLANT
GOWN STRL REUS W/TWL 2XL LVL3 (GOWN DISPOSABLE) ×3 IMPLANT
GOWN STRL REUS W/TWL LRG LVL3 (GOWN DISPOSABLE) ×2
KIT BASIN OR (CUSTOM PROCEDURE TRAY) ×3 IMPLANT
KIT MARKER MARGIN INK (KITS) ×3 IMPLANT
LIGHT WAVEGUIDE WIDE FLAT (MISCELLANEOUS) ×3 IMPLANT
NEEDLE HYPO 25X1 1.5 SAFETY (NEEDLE) ×3 IMPLANT
NS IRRIG 1000ML POUR BTL (IV SOLUTION) IMPLANT
PACK SURGICAL SETUP 50X90 (CUSTOM PROCEDURE TRAY) ×3 IMPLANT
PAD ABD 8X10 STRL (GAUZE/BANDAGES/DRESSINGS) ×3 IMPLANT
PENCIL BUTTON HOLSTER BLD 10FT (ELECTRODE) ×3 IMPLANT
SPONGE GAUZE 4X4 12PLY STER LF (GAUZE/BANDAGES/DRESSINGS) ×3 IMPLANT
SPONGE LAP 18X18 X RAY DECT (DISPOSABLE) ×6 IMPLANT
STRIP CLOSURE SKIN 1/2X4 (GAUZE/BANDAGES/DRESSINGS) ×2 IMPLANT
SUT MNCRL AB 4-0 PS2 18 (SUTURE) ×3 IMPLANT
SUT SILK 2 0 SH (SUTURE) IMPLANT
SUT VIC AB 2-0 SH 27 (SUTURE) ×2
SUT VIC AB 2-0 SH 27XBRD (SUTURE) ×1 IMPLANT
SUT VIC AB 3-0 SH 27 (SUTURE) ×2
SUT VIC AB 3-0 SH 27X BRD (SUTURE) ×1 IMPLANT
SYR BULB 3OZ (MISCELLANEOUS) ×3 IMPLANT
SYR CONTROL 10ML LL (SYRINGE) ×3 IMPLANT
TOWEL OR 17X24 6PK STRL BLUE (TOWEL DISPOSABLE) ×3 IMPLANT
TOWEL OR 17X26 10 PK STRL BLUE (TOWEL DISPOSABLE) ×3 IMPLANT
TUBE CONNECTING 12'X1/4 (SUCTIONS) ×1
TUBE CONNECTING 12X1/4 (SUCTIONS) ×2 IMPLANT
YANKAUER SUCT BULB TIP NO VENT (SUCTIONS) ×3 IMPLANT

## 2016-11-25 NOTE — Discharge Instructions (Signed)
[facility] °Office Phone Number [phone] ° °BREAST BIOPSY/ PARTIAL MASTECTOMY: POST OP INSTRUCTIONS ° °Always review your discharge instruction sheet given to you by the facility where your surgery was performed. ° °IF YOU HAVE DISABILITY OR FAMILY LEAVE FORMS, YOU MUST BRING THEM TO THE OFFICE FOR PROCESSING.  DO NOT GIVE THEM TO YOUR DOCTOR. ° °1. A prescription for pain medication may be given to you upon discharge.  Take your pain medication as prescribed, if needed.  If narcotic pain medicine is not needed, then you may take acetaminophen (Tylenol) or ibuprofen (Advil) as needed. °2. Take your usually prescribed medications unless otherwise directed °3. If you need a refill on your pain medication, please contact your pharmacy.  They will contact our office to request authorization.  Prescriptions will not be filled after 5pm or on week-ends. °4. You should eat very light the first 24 hours after surgery, such as soup, crackers, pudding, etc.  Resume your normal diet the day after surgery. °5. Most patients will experience some swelling and bruising in the breast.  Ice packs and a good support bra will help.  Swelling and bruising can take several days to resolve.  °6. It is common to experience some constipation if taking pain medication after surgery.  Increasing fluid intake and taking a stool softener will usually help or prevent this problem from occurring.  A mild laxative (Milk of Magnesia or Miralax) should be taken according to package directions if there are no bowel movements after 48 hours. °7. Unless discharge instructions indicate otherwise, you may remove your bandages 48 hours after surgery, and you may shower at that time.  You may have steri-strips (small skin tapes) in place directly over the incision.  These strips should be left on the skin for 7-10 days.   Any sutures or staples will be removed at the office during your follow-up visit. °8. ACTIVITIES:  You may resume  regular daily activities (gradually increasing) beginning the next day.  Wearing a good support bra or sports bra (or the breast binder) minimizes pain and swelling.  You may have sexual intercourse when it is comfortable. °a. You may drive when you no longer are taking prescription pain medication, you can comfortably wear a seatbelt, and you can safely maneuver your car and apply brakes. °b. RETURN TO WORK:  __________1 week_______________ °9. You should see your doctor in the office for a follow-up appointment approximately two weeks after your surgery.  Your doctor’s nurse will typically make your follow-up appointment when she calls you with your pathology report.  Expect your pathology report 2-3 business days after your surgery.  You may call to check if you do not hear from us after three days. ° ° °WHEN TO CALL YOUR DOCTOR: °1. Fever over 101.0 °2. Nausea and/or vomiting. °3. Extreme swelling or bruising. °4. Continued bleeding from incision. °5. Increased pain, redness, or drainage from the incision. ° °The clinic staff is available to answer your questions during regular business hours.  Please don’t hesitate to call and ask to speak to one of the nurses for clinical concerns.  If you have a medical emergency, go to the nearest emergency room or call 911.  A surgeon from [facility] is always on call at the hospital. ° °For further questions, please visit [email or web address]  ° °

## 2016-11-25 NOTE — Interval H&P Note (Signed)
History and Physical Interval Note:  11/25/2016 7:11 AM  Patty Thompson  has presented today for surgery, with the diagnosis of RIGHT BREAST ABNORMAL MAMMOGRAM  The various methods of treatment have been discussed with the patient and family. After consideration of risks, benefits and other options for treatment, the patient has consented to  Procedure(s): BREAST LUMPECTOMY WITH RADIOACTIVE SEED LOCALIZATION (Right) as a surgical intervention .  The patient's history has been reviewed, patient examined, no change in status, stable for surgery.  I have reviewed the patient's chart and labs.  Questions were answered to the patient's satisfaction.     Julitza Rickles

## 2016-11-25 NOTE — Progress Notes (Signed)
Report given to robin roberts rn as cargiver 

## 2016-11-25 NOTE — Op Note (Signed)
Right Breast Radioactive seed localized excisional biopsy  Indications: This patient presents with history of abnormal mammogram and discordant biopsy  Pre-operative Diagnosis: right abnormal mammogram  Post-operative Diagnosis: Same  Surgeon: Stark Klein   Anesthesia: General endotracheal anesthesia  ASA Class: 2  Procedure Details  The patient was seen in the Holding Room. The risks, benefits, complications, treatment options, and expected outcomes were discussed with the patient. The possibilities of bleeding, infection, the need for additional procedures, failure to diagnose a condition, and creating a complication requiring transfusion or operation were discussed with the patient. The patient concurred with the proposed plan, giving informed consent.  The site of surgery properly noted/marked. The patient was taken to Operating Room # 1, identified, and the procedure verified as Right Breast Seed localized excisional biopsy. A Time Out was held and the above information confirmed.  The right breast and chest were prepped and draped in standard fashion. The lumpectomy was performed by creating a superior circumareolar incision over the previously placed radioactive seed.  Dissection was carried down to around the point of maximum signal intensity. The cautery was used to perform the dissection.  Hemostasis was achieved with cautery.   The specimen was inked with the margin marker paint kit.    Specimen radiography confirmed inclusion of the mammographic lesion and the seed.  The background signal in the breast was zero.  The wound was irrigated and closed with 3-0 vicryl in layers and 4-0 monocryl subcuticular suture.      Sterile dressings were applied. At the end of the operation, all sponge, instrument, and needle counts were correct.  Findings: grossly clear surgical margins and no adenopathy  Estimated Blood Loss:  min         Specimens: Right breast tissue with seed          Complications:  None; patient tolerated the procedure well.         Disposition: PACU - hemodynamically stable.         Condition: stable

## 2016-11-25 NOTE — Anesthesia Postprocedure Evaluation (Signed)
Anesthesia Post Note  Patient: RHEDA WYMA  Procedure(s) Performed: Procedure(s) (LRB): RIGHT BREAST LUMPECTOMY WITH RADIOACTIVE SEED LOCALIZATION (Right)  Patient location during evaluation: PACU Anesthesia Type: General Level of consciousness: awake and alert Pain management: pain level controlled Vital Signs Assessment: post-procedure vital signs reviewed and stable Respiratory status: spontaneous breathing, nonlabored ventilation, respiratory function stable and patient connected to nasal cannula oxygen Cardiovascular status: blood pressure returned to baseline and stable Postop Assessment: no signs of nausea or vomiting Anesthetic complications: no       Last Vitals:  Vitals:   11/25/16 0945 11/25/16 1000  BP: 107/65 108/64  Pulse: 60 (!) 56  Resp:    Temp:      Last Pain:  Vitals:   11/25/16 0945  TempSrc:   PainSc: 1                  Catalina Gravel

## 2016-11-25 NOTE — Transfer of Care (Signed)
Immediate Anesthesia Transfer of Care Note  Patient: Patty Thompson  Procedure(s) Performed: Procedure(s): RIGHT BREAST LUMPECTOMY WITH RADIOACTIVE SEED LOCALIZATION (Right)  Patient Location: PACU  Anesthesia Type:General  Level of Consciousness: awake, patient cooperative and responds to stimulation  Airway & Oxygen Therapy: Patient Spontanous Breathing and Patient connected to nasal cannula oxygen  Post-op Assessment: Report given to RN and Post -op Vital signs reviewed and stable  Post vital signs: Reviewed and stable  Last Vitals:  Vitals:   11/25/16 0547 11/25/16 0854  BP: 108/61 (P) 113/60  Pulse: (!) 54   Resp: 20 (P) 12  Temp: 36.6 C (P) 36.7 C    Last Pain:  Vitals:   11/25/16 0854  TempSrc:   PainSc: (P) 0-No pain         Complications: No apparent anesthesia complications

## 2016-11-25 NOTE — Anesthesia Preprocedure Evaluation (Addendum)
Anesthesia Evaluation  Patient identified by MRN, date of birth, ID band Patient awake    Reviewed: Allergy & Precautions, NPO status , Patient's Chart, lab work & pertinent test results  Airway Mallampati: II  TM Distance: >3 FB Neck ROM: Full    Dental  (+) Teeth Intact, Dental Advisory Given   Pulmonary former smoker,    Pulmonary exam normal breath sounds clear to auscultation       Cardiovascular Exercise Tolerance: Good Normal cardiovascular exam+ Valvular Problems/Murmurs MVP  Rhythm:Regular Rate:Normal     Neuro/Psych PSYCHIATRIC DISORDERS Anxiety negative neurological ROS     GI/Hepatic Neg liver ROS, GERD  Medicated and Controlled,  Endo/Other  Hypothyroidism   Renal/GU negative Renal ROS     Musculoskeletal negative musculoskeletal ROS (+)   Abdominal   Peds  Hematology negative hematology ROS (+)   Anesthesia Other Findings Day of surgery medications reviewed with the patient.  Right breast cancer  Reproductive/Obstetrics                            Anesthesia Physical Anesthesia Plan  ASA: II  Anesthesia Plan: General   Post-op Pain Management:    Induction: Intravenous  Airway Management Planned: LMA  Additional Equipment:   Intra-op Plan:   Post-operative Plan: Extubation in OR  Informed Consent: I have reviewed the patients History and Physical, chart, labs and discussed the procedure including the risks, benefits and alternatives for the proposed anesthesia with the patient or authorized representative who has indicated his/her understanding and acceptance.   Dental advisory given  Plan Discussed with: CRNA  Anesthesia Plan Comments: (Risks/benefits of general anesthesia discussed with patient including risk of damage to teeth, lips, gum, and tongue, nausea/vomiting, allergic reactions to medications, and the possibility of heart attack, stroke and death.   All patient questions answered.  Patient wishes to proceed.)       Anesthesia Quick Evaluation

## 2016-11-26 ENCOUNTER — Encounter (HOSPITAL_COMMUNITY): Payer: Self-pay | Admitting: General Surgery

## 2016-12-01 NOTE — Progress Notes (Signed)
Please let patient know pathology is negative for cancer.

## 2016-12-08 NOTE — Progress Notes (Signed)
Please let pt know biopsy negative for cancer.

## 2016-12-27 ENCOUNTER — Other Ambulatory Visit: Payer: Self-pay | Admitting: General Surgery

## 2016-12-27 DIAGNOSIS — N6091 Unspecified benign mammary dysplasia of right breast: Secondary | ICD-10-CM

## 2017-01-07 ENCOUNTER — Ambulatory Visit
Admission: RE | Admit: 2017-01-07 | Discharge: 2017-01-07 | Disposition: A | Discharge status: Home / Self Care | Payer: Managed Care, Other (non HMO) | Source: Ambulatory Visit | Attending: General Surgery | Admitting: General Surgery

## 2017-01-07 DIAGNOSIS — N6091 Unspecified benign mammary dysplasia of right breast: Secondary | ICD-10-CM

## 2017-01-07 MED ORDER — GADOBENATE DIMEGLUMINE 529 MG/ML IV SOLN
14.0000 mL | Freq: Once | INTRAVENOUS | Status: AC | PRN
  Administered 2017-01-07: 14 mL via INTRAVENOUS

## 2017-01-08 NOTE — Progress Notes (Signed)
MRI looks good!  No evidence of cancer.  Please let patient know.

## 2017-01-17 ENCOUNTER — Ambulatory Visit: Payer: Managed Care, Other (non HMO) | Admitting: Hematology and Oncology

## 2017-01-17 NOTE — Assessment & Plan Note (Deleted)
Right breast biopsy upper inner quadrant 09/15/2016: Atypical lobular hyperplasia involving a complex sclerosing lesion  Pathology review: ALH confers a substantially increased risk of subsequent breast cancer. Studies revealed that the relative risk can be 3 times an average woman. It can increase risk of both same side as well as contralateral breast cancer.Based on [place], patient's lifestyle risk of breast cancer is 26% while an average risk is 11.4%  Patient's 5 year risk of developing breast cancer is 3.2% but an average risk is 1.2%  Tamoxifen toxicities:  Return to clinic in 6 months for follow-up

## 2018-08-02 ENCOUNTER — Other Ambulatory Visit: Payer: Self-pay | Admitting: General Surgery

## 2018-08-02 DIAGNOSIS — N6091 Unspecified benign mammary dysplasia of right breast: Secondary | ICD-10-CM

## 2018-09-11 ENCOUNTER — Other Ambulatory Visit: Payer: Managed Care, Other (non HMO)

## 2018-09-11 ENCOUNTER — Ambulatory Visit
Admission: RE | Admit: 2018-09-11 | Discharge: 2018-09-11 | Disposition: A | Discharge status: Home / Self Care | Payer: 59 | Source: Ambulatory Visit | Attending: General Surgery | Admitting: General Surgery

## 2018-09-11 DIAGNOSIS — N6091 Unspecified benign mammary dysplasia of right breast: Secondary | ICD-10-CM

## 2018-09-11 MED ORDER — GADOBENATE DIMEGLUMINE 529 MG/ML IV SOLN
15.0000 mL | Freq: Once | INTRAVENOUS | Status: AC | PRN
  Administered 2018-09-11: 15 mL via INTRAVENOUS

## 2018-09-12 NOTE — Progress Notes (Signed)
Please let patient know that we will need to get needle biopsy.

## 2018-09-13 ENCOUNTER — Other Ambulatory Visit: Payer: Self-pay | Admitting: General Surgery

## 2018-09-13 DIAGNOSIS — R9389 Abnormal findings on diagnostic imaging of other specified body structures: Secondary | ICD-10-CM

## 2018-09-27 ENCOUNTER — Ambulatory Visit
Admission: RE | Admit: 2018-09-27 | Discharge: 2018-09-27 | Disposition: A | Discharge status: Home / Self Care | Payer: 59 | Source: Ambulatory Visit | Attending: General Surgery | Admitting: General Surgery

## 2018-09-27 DIAGNOSIS — R9389 Abnormal findings on diagnostic imaging of other specified body structures: Secondary | ICD-10-CM

## 2018-09-27 MED ORDER — GADOBUTROL 1 MMOL/ML IV SOLN: 7.0000 mL | Freq: Once | INTRAVENOUS | Status: DC | PRN

## 2018-09-27 MED ORDER — GADOBUTROL 1 MMOL/ML IV SOLN
7.0000 mL | Freq: Once | INTRAVENOUS | Status: AC | PRN
  Administered 2018-09-27: 7 mL via INTRAVENOUS

## 2019-01-18 ENCOUNTER — Encounter: Payer: Self-pay | Admitting: Allergy and Immunology

## 2019-01-18 ENCOUNTER — Ambulatory Visit (INDEPENDENT_AMBULATORY_CARE_PROVIDER_SITE_OTHER): Payer: PRIVATE HEALTH INSURANCE | Admitting: Allergy and Immunology

## 2019-01-18 VITALS — BP 118/62 | HR 52 | Temp 98.2°F | Resp 16 | Ht 66.0 in | Wt 167.8 lb

## 2019-01-18 DIAGNOSIS — R062 Wheezing: Secondary | ICD-10-CM | POA: Diagnosis not present

## 2019-01-18 DIAGNOSIS — H1010 Acute atopic conjunctivitis, unspecified eye: Secondary | ICD-10-CM | POA: Insufficient documentation

## 2019-01-18 DIAGNOSIS — L2084 Intrinsic (allergic) eczema: Secondary | ICD-10-CM | POA: Insufficient documentation

## 2019-01-18 DIAGNOSIS — J3089 Other allergic rhinitis: Secondary | ICD-10-CM | POA: Diagnosis not present

## 2019-01-18 DIAGNOSIS — H1013 Acute atopic conjunctivitis, bilateral: Secondary | ICD-10-CM | POA: Diagnosis not present

## 2019-01-18 DIAGNOSIS — L209 Atopic dermatitis, unspecified: Secondary | ICD-10-CM

## 2019-01-18 DIAGNOSIS — L2089 Other atopic dermatitis: Secondary | ICD-10-CM

## 2019-01-18 MED ORDER — CRISABOROLE 2 % EX OINT: 1.0000 "application " | TOPICAL_OINTMENT | Freq: Two times a day (BID) | CUTANEOUS | 5 refills | Status: DC | PRN

## 2019-01-18 MED ORDER — OLOPATADINE HCL 0.7 % OP SOLN: 1.0000 [drp] | Freq: Every day | OPHTHALMIC | 5 refills | Status: DC | PRN

## 2019-01-18 MED ORDER — ALBUTEROL SULFATE HFA 108 (90 BASE) MCG/ACT IN AERS: 2.0000 | INHALATION_SPRAY | RESPIRATORY_TRACT | 1 refills | Status: DC | PRN

## 2019-01-18 MED ORDER — FLUTICASONE PROPIONATE 93 MCG/ACT NA EXHU: 2.0000 | INHALANT_SUSPENSION | Freq: Two times a day (BID) | NASAL | 5 refills | Status: DC

## 2019-01-18 MED ORDER — MONTELUKAST SODIUM 10 MG PO TABS: 10.0000 mg | ORAL_TABLET | Freq: Every day | ORAL | 5 refills | Status: DC

## 2019-01-18 MED ORDER — LEVOCETIRIZINE DIHYDROCHLORIDE 5 MG PO TABS: 5.0000 mg | ORAL_TABLET | Freq: Every evening | ORAL | 5 refills | Status: DC

## 2019-01-18 NOTE — Assessment & Plan Note (Signed)
   Treatment plan as outlined above for allergic rhinitis.  A prescription has been provided for Pazeo, one drop per eye daily as needed.  I have also recommended eye lubricant drops (i.e., Natural Tears) as needed. 

## 2019-01-18 NOTE — Progress Notes (Signed)
New Patient Note  RE: Anjannette Gauger MRN: [id number] DOB: [birth year] Date of Office Visit: 01/18/2019  Referring provider: Drake Leach, MD Primary care provider: Drake Leach, MD  Chief Complaint: Allergic Rhinitis  and Conjunctivitis   History of present illness: Patty Thompson is a 53 y.o. female seen today in consultation requested by Drake Leach, MD.  She experiences frequent nasal congestion, rhinorrhea, sneezing, postnasal drainage, nasal pruritus, ocular pruritus, lacrimation, and sinus pressure over the cheekbones.  These symptoms had only occurred during the springtime in the fall until approximately 5 years ago.  Currently the nasal/ocular/sinus symptoms occur year-round but are more severe in the springtime and in the fall.  She reports that in the springtime and in the fall her eyes water some months that it appears that she is crying.  She attempts to control the symptoms with cetirizine which she has taken daily over the past 2 years.  In addition, she tried a generic over-the-counter nasal spray approximately 2 weeks ago without significant relief.  She notes that in the spring and in the fall she typically develops bronchitis, consisting of a productive cough and wheezing. Elizibeth has had eczema on the hands, chest, and occasionally in the face over the past 5 to 10 years.  She controls the eczema with betamethasone.  Assessment and plan: Seasonal and perennial allergic rhinitis  Aeroallergen avoidance measures have been discussed and provided in written form.  A prescription has been provided for montelukast 10 mg daily at bedtime.  A prescription has been provided for levocetirizine, 5 mg daily as needed.  To avoid diminishing benefit with daily use (tachyphylaxis) of second generation antihistamine, consider alternating every few months between fexofenadine (Allegra) and levocetirizine (Xyzal).  A prescription has been provided for [facility],  2 actuations per nostril twice daily as needed.  Nasal saline spray (i.e., Simply Saline) or nasal saline lavage (i.e., NeilMed) is recommended as needed and prior to medicated nasal sprays.  The risks and benefits of aeroallergen immunotherapy have been discussed. The patient is interested in the possibility of initiating immunotherapy if insurance coverage is favorable. She will let us know how she would like to proceed.  Allergic conjunctivitis  Treatment plan as outlined above for allergic rhinitis.  A prescription has been provided for Pazeo, one drop per eye daily as needed.  I have also recommended eye lubricant drops (i.e., Natural Tears) as needed.  Coughing/wheezing  Montelukast has been prescribed (as above).  A prescription has been provided for albuterol HFA, 1 to 2 inhalations every 4-6 hours if needed.  Subjective and objective measures of pulmonary function will be followed and the treatment plan will be adjusted accordingly.  Atopic dermatitis  Continue appropriate skin care measures.  A prescription has been provided for Eucrisa (crisaborole) 2% ointment twice a day to affected areas as needed.  For more stubborn eczema, apply betamethasone to affected areas as needed.   Meds ordered this encounter  Medications  . montelukast (SINGULAIR) 10 MG tablet    Sig: Take 1 tablet (10 mg total) by mouth at bedtime.    Dispense:  30 tablet    Refill:  5  . levocetirizine (XYZAL) 5 MG tablet    Sig: Take 1 tablet (5 mg total) by mouth every evening.    Dispense:  30 tablet    Refill:  5  . Olopatadine HCl (PAZEO) 0.7 % SOLN    Sig: Place 1 drop into both eyes daily as needed (for itchy eyes).  Dispense:  2.5 mL    Refill:  5  . albuterol (PROVENTIL HFA;VENTOLIN HFA) 108 (90 Base) MCG/ACT inhaler    Sig: Inhale 2 puffs into the lungs every 4 (four) hours as needed for wheezing or shortness of breath.    Dispense:  18 g    Refill:  1  . Crisaborole (EUCRISA) 2  % OINT    Sig: Apply 1 application topically 2 (two) times daily as needed (to red itchy areas).    Dispense:  100 g    Refill:  5  . Fluticasone Propionate (XHANCE) 93 MCG/ACT EXHU    Sig: Place 2 sprays into the nose 2 (two) times daily.    Dispense:  32 mL    Refill:  5    Diagnostics: Spirometry: Spirometry reveals an FVC of 3.55 L and an FEV1 of 2.45 L (82% predicted) with 100 mL postbronchodilator improvement.  This study was performed while the patient was asymptomatic.  Please see scanned spirometry results for details. Environmental skin testing: Positive to grass pollen, weed pollen, ragweed pollen, tree pollen, cat hair, dog epithelia, cockroach antigen, and dust mite antigen.  Physical examination: Blood pressure 118/62, pulse (!) 52, temperature 98.2 F (36.8 C), temperature source Oral, resp. rate 16, height 5\' 6"  (1.676 m), weight 167 lb 12.8 oz (76.1 kg), SpO2 96 %.  General: Alert, interactive, in no acute distress. HEENT: TMs pearly gray, turbinates moderately edematous without discharge, post-pharynx moderately erythematous. Neck: Supple without lymphadenopathy. Lungs: Clear to auscultation without wheezing, rhonchi or rales. CV: Normal S1, S2 without murmurs. Abdomen: Nondistended, nontender. Skin: Warm and dry, without lesions or rashes. Extremities:  No clubbing, cyanosis or edema. Neuro:   Grossly intact.  Review of systems:  Review of systems negative except as noted in HPI / PMHx or noted below: Review of Systems  Constitutional: Negative.   HENT: Negative.   Eyes: Negative.   Respiratory: Negative.   Cardiovascular: Negative.   Gastrointestinal: Negative.   Genitourinary: Negative.   Musculoskeletal: Negative.   Skin: Negative.   Neurological: Negative.   Endo/Heme/Allergies: Negative.   Psychiatric/Behavioral: Negative.     Past medical history:  Past Medical History:  Diagnosis Date  . Anxiety   . Arthritis   . Breast cancer ([place])  09/15/2016   Right breast upper inner  . Complication of anesthesia    during colonoscopy pt. could feel what was going on,stated she screamed  . Eczema   . GERD (gastroesophageal reflux disease)   . Hypothyroidism   . Mitral valve prolapse     Past surgical history:  Past Surgical History:  Procedure Laterality Date  . BREAST LUMPECTOMY WITH RADIOACTIVE SEED LOCALIZATION Right 11/25/2016   Procedure: RIGHT BREAST LUMPECTOMY WITH RADIOACTIVE SEED LOCALIZATION;  Surgeon: Stark Klein, MD;  Location: [facility];  Service: General;  Laterality: Right;  . COLONOSCOPY    . WISDOM TOOTH EXTRACTION      Family history: Family History  Problem Relation Age of Onset  . COPD Mother   . Urticaria Sister   . Eczema Maternal Uncle   . Allergic rhinitis Neg Hx   . Angioedema Neg Hx   . Asthma Neg Hx   . Immunodeficiency Neg Hx     Social history: Social History   Socioeconomic History  . Marital status: Married    Spouse name: Not on file  . Number of children: Not on file  . Years of education: Not on file  . Highest education level: Not on file  Occupational History  . Not on file  Social Needs  . Financial resource strain: Not on file  . Food insecurity:    Worry: Not on file    Inability: Not on file  . Transportation needs:    Medical: Not on file    Non-medical: Not on file  Tobacco Use  . Smoking status: Former Smoker    Packs/day: 0.50    Years: 3.00    Pack years: 1.50    Last attempt to quit: 11/19/1988    Years since quitting: 30.1  . Smokeless tobacco: Never Used  Substance and Sexual Activity  . Alcohol use: Yes    Comment: monthly  . Drug use: No  . Sexual activity: Not on file  Lifestyle  . Physical activity:    Days per week: Not on file    Minutes per session: Not on file  . Stress: Not on file  Relationships  . Social connections:    Talks on phone: Not on file    Gets together: Not on file    Attends religious service: Not on file    Active  member of club or organization: Not on file    Attends meetings of clubs or organizations: Not on file    Relationship status: Not on file  . Intimate partner violence:    Fear of current or ex partner: Not on file    Emotionally abused: Not on file    Physically abused: Not on file    Forced sexual activity: Not on file  Other Topics Concern  . Not on file  Social History Narrative  . Not on file   Environmental History: The patient lives in a house built in [place] with carpeting in the bedroom, gas heat, and central air.  There is possibly mold/water damage in the home, though she is uncertain.  There are 2 dogs in the home which have access to her bedroom.  There is a cat in the home which does not have access to her bedroom.  She is a non-smoker.  Allergies as of 01/18/2019      Reactions   Sulfa Antibiotics Itching, Swelling, Rash   FEVER   Tape Itching, Rash      Medication List       Accurate as of January 18, 2019  1:23 PM. Always use your most recent med list.        acetaminophen 500 MG tablet Commonly known as:  TYLENOL Take 1,000 mg by mouth every 6 (six) hours as needed for headache.   albuterol 108 (90 Base) MCG/ACT inhaler Commonly known as:  PROVENTIL HFA;VENTOLIN HFA Inhale 2 puffs into the lungs every 4 (four) hours as needed for wheezing or shortness of breath.   BIOTIN PO Take 1 tablet by mouth daily.   calcium carbonate 500 MG chewable tablet Commonly known as:  TUMS - dosed in mg elemental calcium Chew 2-3 tablets by mouth daily as needed for indigestion or heartburn.   CENTRUM SILVER 50+WOMEN Tabs Take 1 tablet by mouth daily.   cetirizine 10 MG tablet Commonly known as:  ZYRTEC Take 10 mg by mouth at bedtime.   Crisaborole 2 % Oint Commonly known as:  EUCRISA Apply 1 application topically 2 (two) times daily as needed (to red itchy areas).   Fluticasone Propionate 93 MCG/ACT Exhu Commonly known as:  XHANCE Place 2 sprays into the nose 2  (two) times daily.   ibuprofen 200 MG tablet Commonly known as:  ADVIL,MOTRIN  Take 200 mg by mouth every 6 (six) hours as needed for moderate pain.   levocetirizine 5 MG tablet Commonly known as:  XYZAL Take 1 tablet (5 mg total) by mouth every evening.   levothyroxine 137 MCG tablet Commonly known as:  SYNTHROID, LEVOTHROID Take 137 mcg by mouth daily before breakfast.   montelukast 10 MG tablet Commonly known as:  SINGULAIR Take 1 tablet (10 mg total) by mouth at bedtime.   Olopatadine HCl 0.7 % Soln Commonly known as:  PAZEO Place 1 drop into both eyes daily as needed (for itchy eyes).   PARoxetine 20 MG tablet Commonly known as:  PAXIL Take 5 mg by mouth at bedtime.   vitamin C 1000 MG tablet Take 1,000 mg by mouth daily.   Vitamin D3 50 MCG (2000 UT) capsule Take 6,000 Units by mouth daily.       Known medication allergies: Allergies  Allergen Reactions  . Sulfa Antibiotics Itching, Swelling and Rash    FEVER  . Tape Itching and Rash    I appreciate the opportunity to take part in Demetrius's care. Please do not hesitate to contact me with questions.  Sincerely,   R. Edgar Frisk, MD

## 2019-01-18 NOTE — Patient Instructions (Addendum)
Seasonal and perennial allergic rhinitis  Aeroallergen avoidance measures have been discussed and provided in written form.  A prescription has been provided for montelukast 10 mg daily at bedtime.  A prescription has been provided for levocetirizine, 5 mg daily as needed.  To avoid diminishing benefit with daily use (tachyphylaxis) of second generation antihistamine, consider alternating every few months between fexofenadine (Allegra) and levocetirizine (Xyzal).  A prescription has been provided for [facility], 2 actuations per nostril twice daily as needed.  Nasal saline spray (i.e., Simply Saline) or nasal saline lavage (i.e., NeilMed) is recommended as needed and prior to medicated nasal sprays.  The risks and benefits of aeroallergen immunotherapy have been discussed. The patient is interested in the possibility of initiating immunotherapy if insurance coverage is favorable. She will let us know how she would like to proceed.  Allergic conjunctivitis  Treatment plan as outlined above for allergic rhinitis.  A prescription has been provided for Pazeo, one drop per eye daily as needed.  I have also recommended eye lubricant drops (i.e., Natural Tears) as needed.  Coughing/wheezing  Montelukast has been prescribed (as above).  A prescription has been provided for albuterol HFA, 1 to 2 inhalations every 4-6 hours if needed.  Subjective and objective measures of pulmonary function will be followed and the treatment plan will be adjusted accordingly.  Atopic dermatitis  Continue appropriate skin care measures.  A prescription has been provided for Eucrisa (crisaborole) 2% ointment twice a day to affected areas as needed.  For more stubborn eczema, apply betamethasone to affected areas as needed.   Return in about 3 months (around 04/18/2019), or if symptoms worsen or fail to improve.   Control of Dust Mite Allergen  House dust mites play a major role in allergic asthma and  rhinitis.  They occur in environments with high humidity wherever human skin, the food for dust mites is found. High levels have been detected in dust obtained from mattresses, pillows, carpets, upholstered furniture, bed covers, clothes and soft toys.  The principal allergen of the house dust mite is found in its feces.  A gram of dust may contain 1,000 mites and 250,000 fecal particles.  Mite antigen is easily measured in the air during house cleaning activities.    1. Encase mattresses, including the box spring, and pillow, in an air tight cover.  Seal the zipper end of the encased mattresses with wide adhesive tape. 2. Wash the bedding in water of 130 degrees Farenheit weekly.  Avoid cotton comforters/quilts and flannel bedding: the most ideal bed covering is the dacron comforter. 3. Remove all upholstered furniture from the bedroom. 4. Remove carpets, carpet padding, rugs, and non-washable window drapes from the bedroom.  Wash drapes weekly or use plastic window coverings. 5. Remove all non-washable stuffed toys from the bedroom.  Wash stuffed toys weekly. 6. Have the room cleaned frequently with a vacuum cleaner and a damp dust-mop.  The patient should not be in a room which is being cleaned and should wait 1 hour after cleaning before going into the room. 7. Close and seal all heating outlets in the bedroom.  Otherwise, the room will become filled with dust-laden air.  An electric heater can be used to heat the room. Reduce indoor humidity to less than 50%.  Do not use a humidifier.   Reducing Pollen Exposure  The American Academy of Allergy, Asthma and Immunology suggests the following steps to reduce your exposure to pollen during allergy seasons.    1.  Do not hang sheets or clothing out to dry; pollen may collect on these items. 2. Do not mow lawns or spend time around freshly cut grass; mowing stirs up pollen. 3. Keep windows closed at night.  Keep car windows closed while  driving. 4. Minimize morning activities outdoors, a time when pollen counts are usually at their highest. 5. Stay indoors as much as possible when pollen counts or humidity is high and on windy days when pollen tends to remain in the air longer. 6. Use air conditioning when possible.  Many air conditioners have filters that trap the pollen spores. 7. Use a HEPA room air filter to remove pollen form the indoor air you breathe.   Control of Dog or Cat Allergen  Avoidance is the best way to manage a dog or cat allergy. If you have a dog or cat and are allergic to dog or cats, consider removing the dog or cat from the home. If you have a dog or cat but don't want to find it a new home, or if your family wants a pet even though someone in the household is allergic, here are some strategies that may help keep symptoms at bay:  1. Keep the pet out of your bedroom and restrict it to only a few rooms. Be advised that keeping the dog or cat in only one room will not limit the allergens to that room. 2. Don't pet, hug or kiss the dog or cat; if you do, wash your hands with soap and water. 3. High-efficiency particulate air (HEPA) cleaners run continuously in a bedroom or living room can reduce allergen levels over time. 4. Place electrostatic material sheet in the air inlet vent in the bedroom. 5. Regular use of a high-efficiency vacuum cleaner or a central vacuum can reduce allergen levels. 6. Giving your dog or cat a bath at least once a week can reduce airborne allergen.   Control of Cockroach Allergen  Cockroach allergen has been identified as an important cause of acute attacks of asthma, especially in urban settings.  There are fifty-five species of cockroach that exist in the [place], however only three, the [place], [place] species produce allergen that can affect patients with Asthma.  Allergens can be obtained from fecal particles, egg casings and secretions from  cockroaches.    1. Remove food sources. 2. Reduce access to water. 3. Seal access and entry points. 4. Spray runways with 0.5-1% Diazinon or Chlorpyrifos 5. Blow boric acid power under stoves and refrigerator. 6. Place bait stations (hydramethylnon) at feeding sites.

## 2019-01-18 NOTE — Assessment & Plan Note (Addendum)
   Aeroallergen avoidance measures have been discussed and provided in written form.  A prescription has been provided for montelukast 10 mg daily at bedtime.  A prescription has been provided for levocetirizine, 5 mg daily as needed.  To avoid diminishing benefit with daily use (tachyphylaxis) of second generation antihistamine, consider alternating every few months between fexofenadine (Allegra) and levocetirizine (Xyzal).  A prescription has been provided for [facility], 2 actuations per nostril twice daily as needed.  Nasal saline spray (i.e., Simply Saline) or nasal saline lavage (i.e., NeilMed) is recommended as needed and prior to medicated nasal sprays.  The risks and benefits of aeroallergen immunotherapy have been discussed. The patient is interested in the possibility of initiating immunotherapy if insurance coverage is favorable. She will let us know how she would like to proceed.

## 2019-01-18 NOTE — Assessment & Plan Note (Signed)
   Continue appropriate skin care measures.  A prescription has been provided for Eucrisa (crisaborole) 2% ointment twice a day to affected areas as needed.  For more stubborn eczema, apply betamethasone to affected areas as needed.

## 2019-01-18 NOTE — Assessment & Plan Note (Signed)
   Montelukast has been prescribed (as above).  A prescription has been provided for albuterol HFA, 1 to 2 inhalations every 4-6 hours if needed.  Subjective and objective measures of pulmonary function will be followed and the treatment plan will be adjusted accordingly.

## 2019-01-19 ENCOUNTER — Telehealth: Payer: Self-pay

## 2019-01-19 NOTE — Telephone Encounter (Signed)
[employer] spoke to [name] representing eucrisa 2% ointment. Patty Thompson wanting to know if pt. Has tried any other topical creams and I didn't see where she tried any so I called the pt. mb too full unable to leave a message.

## 2019-01-22 ENCOUNTER — Other Ambulatory Visit: Payer: Self-pay | Admitting: *Deleted

## 2019-01-22 MED ORDER — OLOPATADINE HCL 0.7 % OP SOLN: 1.0000 [drp] | Freq: Every day | OPHTHALMIC | 5 refills | Status: DC | PRN

## 2019-01-26 ENCOUNTER — Other Ambulatory Visit: Payer: Self-pay

## 2019-01-26 ENCOUNTER — Telehealth: Payer: Self-pay | Admitting: Allergy and Immunology

## 2019-01-26 MED ORDER — OLOPATADINE HCL 0.2 % OP SOLN: 1.0000 [drp] | Freq: Every day | OPHTHALMIC | 5 refills | Status: DC | PRN

## 2019-01-26 NOTE — Telephone Encounter (Signed)
Waiting to hear back from xhance pa will look into pa for pazeo

## 2019-01-26 NOTE — Telephone Encounter (Signed)
Sent in olopatadine 0.2 and as insurance formulary stated it is perferred

## 2019-01-26 NOTE — Telephone Encounter (Signed)
PT came in office with Pazeo coupon that says $10 copay on it. States rx needs prior authorization. Also states another med needs prior authorization. Please confirm and advise pt.

## 2019-01-30 ENCOUNTER — Telehealth: Payer: Self-pay

## 2019-01-30 NOTE — Progress Notes (Signed)
VIALS EXP 01-30-2020

## 2019-01-30 NOTE — Telephone Encounter (Signed)
Patient is a candidate for the [employer] program. I have the forms ready to be signed and they will be available in [place] tomorrow for her to come sign. I called her to go over program information but did not receive an answer.

## 2019-01-31 NOTE — Telephone Encounter (Signed)
I spoke with Patty Thompson and she is in agreement with the [facility] program. She will come by in the morning around 830 to sign the paperwork.

## 2019-01-31 NOTE — Telephone Encounter (Signed)
I left a message for patient on confidential voicemail requesting a call back to discuss the program further.

## 2019-02-01 DIAGNOSIS — J301 Allergic rhinitis due to pollen: Secondary | ICD-10-CM

## 2019-02-05 DIAGNOSIS — J3089 Other allergic rhinitis: Secondary | ICD-10-CM

## 2019-02-15 ENCOUNTER — Ambulatory Visit (INDEPENDENT_AMBULATORY_CARE_PROVIDER_SITE_OTHER): Payer: PRIVATE HEALTH INSURANCE

## 2019-02-15 ENCOUNTER — Other Ambulatory Visit: Payer: Self-pay

## 2019-02-15 DIAGNOSIS — J301 Allergic rhinitis due to pollen: Secondary | ICD-10-CM | POA: Diagnosis not present

## 2019-02-15 MED ORDER — EPINEPHRINE 0.3 MG/0.3ML IJ SOAJ: 0.3000 mg | INTRAMUSCULAR | 1 refills | Status: DC | PRN

## 2019-02-15 NOTE — Addendum Note (Signed)
Addended by: Katherina Right D on: 02/15/2019 05:01 PM   Modules accepted: Orders

## 2019-02-15 NOTE — Progress Notes (Addendum)
Immunotherapy   Patient Details  Name: [name] MRN: [id number] Date of Birth: 1966/07/18  02/15/2019  [name]  PT HERE TO START  ALLERGY INJECTIONS BLUE VIAL Following schedule: A  Frequency:1-2 WEEKLY  Epi-Pen:YES Consent signed and patient instructions given.   [name] 02/15/2019, 11:04 AM  Patient reports feeling "cloudy in my chest" immediately after her first allergen injection. She denies shortness of breath and wheeze. She reports some chest tightness. She denies abdominal symptoms, hives or itch, tongue swelling or tingling, and dizziness. Vital signs stable. Spirometry within normal limits. Symbicort 160 -2 puffs given and repeat spirometry with 3% improvement in FEV1. Patient monitored 40 minutes. Vital signs remained stable throughout the observation period. She will repeat the dose she received today Blue 0.05. She verbalized understanding of strict return precautions and to carry her AuviQ on injection days.   Thank you for the opportunity to care for this patient.  Please do not hesitate to contact me with questions.  Gareth Morgan, FNP [facility]

## 2019-02-16 ENCOUNTER — Other Ambulatory Visit: Payer: Self-pay | Admitting: *Deleted

## 2019-02-23 ENCOUNTER — Ambulatory Visit (INDEPENDENT_AMBULATORY_CARE_PROVIDER_SITE_OTHER): Payer: PRIVATE HEALTH INSURANCE

## 2019-02-23 DIAGNOSIS — J309 Allergic rhinitis, unspecified: Secondary | ICD-10-CM | POA: Diagnosis not present

## 2019-03-01 ENCOUNTER — Ambulatory Visit (INDEPENDENT_AMBULATORY_CARE_PROVIDER_SITE_OTHER): Payer: PRIVATE HEALTH INSURANCE

## 2019-03-01 DIAGNOSIS — J309 Allergic rhinitis, unspecified: Secondary | ICD-10-CM | POA: Diagnosis not present

## 2019-03-08 ENCOUNTER — Ambulatory Visit (INDEPENDENT_AMBULATORY_CARE_PROVIDER_SITE_OTHER): Payer: PRIVATE HEALTH INSURANCE

## 2019-03-08 DIAGNOSIS — J309 Allergic rhinitis, unspecified: Secondary | ICD-10-CM

## 2019-03-14 ENCOUNTER — Ambulatory Visit (INDEPENDENT_AMBULATORY_CARE_PROVIDER_SITE_OTHER): Payer: PRIVATE HEALTH INSURANCE

## 2019-03-14 DIAGNOSIS — J309 Allergic rhinitis, unspecified: Secondary | ICD-10-CM | POA: Diagnosis not present

## 2019-03-22 ENCOUNTER — Ambulatory Visit (INDEPENDENT_AMBULATORY_CARE_PROVIDER_SITE_OTHER): Payer: PRIVATE HEALTH INSURANCE

## 2019-03-22 DIAGNOSIS — J309 Allergic rhinitis, unspecified: Secondary | ICD-10-CM

## 2019-03-27 ENCOUNTER — Ambulatory Visit (INDEPENDENT_AMBULATORY_CARE_PROVIDER_SITE_OTHER): Payer: PRIVATE HEALTH INSURANCE

## 2019-03-27 DIAGNOSIS — J309 Allergic rhinitis, unspecified: Secondary | ICD-10-CM

## 2019-04-06 ENCOUNTER — Ambulatory Visit (INDEPENDENT_AMBULATORY_CARE_PROVIDER_SITE_OTHER): Payer: PRIVATE HEALTH INSURANCE

## 2019-04-06 DIAGNOSIS — J309 Allergic rhinitis, unspecified: Secondary | ICD-10-CM

## 2019-04-12 ENCOUNTER — Ambulatory Visit (INDEPENDENT_AMBULATORY_CARE_PROVIDER_SITE_OTHER): Payer: PRIVATE HEALTH INSURANCE

## 2019-04-12 DIAGNOSIS — J309 Allergic rhinitis, unspecified: Secondary | ICD-10-CM | POA: Diagnosis not present

## 2019-04-13 ENCOUNTER — Telehealth: Payer: Self-pay

## 2019-04-13 NOTE — Telephone Encounter (Signed)
Fax from [facility] pharmacy:  Pazeo not covered Alternatives include:  Ketotifen, azelastine, epinastine, zaditor Please choose a covered alternative, thank you.

## 2019-04-17 MED ORDER — EPINASTINE HCL 0.05 % OP SOLN: 1.0000 [drp] | Freq: Two times a day (BID) | OPHTHALMIC | 5 refills | Status: DC | PRN

## 2019-04-17 NOTE — Telephone Encounter (Signed)
Epinastine, 1 gtt OU bid prn. Thanks.

## 2019-04-17 NOTE — Telephone Encounter (Signed)
Medication sent to pharmacy  

## 2019-04-18 ENCOUNTER — Ambulatory Visit (INDEPENDENT_AMBULATORY_CARE_PROVIDER_SITE_OTHER): Payer: PRIVATE HEALTH INSURANCE

## 2019-04-18 DIAGNOSIS — J309 Allergic rhinitis, unspecified: Secondary | ICD-10-CM

## 2019-04-19 ENCOUNTER — Encounter: Payer: Self-pay | Admitting: Allergy and Immunology

## 2019-04-19 ENCOUNTER — Ambulatory Visit (INDEPENDENT_AMBULATORY_CARE_PROVIDER_SITE_OTHER): Payer: PRIVATE HEALTH INSURANCE | Admitting: Allergy and Immunology

## 2019-04-19 DIAGNOSIS — L2089 Other atopic dermatitis: Secondary | ICD-10-CM

## 2019-04-19 DIAGNOSIS — H1013 Acute atopic conjunctivitis, bilateral: Secondary | ICD-10-CM

## 2019-04-19 DIAGNOSIS — R062 Wheezing: Secondary | ICD-10-CM

## 2019-04-19 DIAGNOSIS — T63481A Toxic effect of venom of other arthropod, accidental (unintentional), initial encounter: Secondary | ICD-10-CM | POA: Insufficient documentation

## 2019-04-19 DIAGNOSIS — J3089 Other allergic rhinitis: Secondary | ICD-10-CM

## 2019-04-19 MED ORDER — OLOPATADINE HCL 0.7 % OP SOLN: 1.0000 [drp] | OPHTHALMIC | 5 refills | Status: AC | PRN

## 2019-04-19 NOTE — Progress Notes (Signed)
Follow-up Telemedicine Note  RE: Thompson Thompson MRN: [id number] DOB: 1966-08-31 Date of Telemedicine Visit: 04/19/2019  Primary care provider: Drake Leach, MD Referring provider: Drake Leach, MD  Telemedicine Follow Up Visit via Telephone: I connected with Thompson Thompson for a follow up on 04/19/19 by telephone and verified that I am speaking with the correct person using two identifiers.   The limitations, risks, security and privacy concerns of performing an evaluation and management service by telemedicine, the availability of in person appointments, and that there may be a patient responsible charge related to this service were discussed. The patient expressed understanding and agreed to proceed.  Patient is at home.  Provider is at the office.  Visit start time: 11:00 am Visit end time: 11:34 am Insurance consent/check in by: [facility] consent and medical assistant/nurse: Logan  History of present illness: [name] is a 53 y.o. female with allergic rhinoconjunctivitis, atopic dermatitis, and history of coughing/wheezing presenting today via telemedicine for follow-up visit.  She was previously seen in this clinic for her initial evaluation on January 18, 2019. She reports that she had taken Pazeo eyedrops with significant relief. However, after the prescription ran out she started OTC Pataday without adequate symptom relief. She notes that her eyes have been puffy in the morning recently. She started epinastine yesterday but would prefer to restart Pazeo if her insurance cover this medication. She is currently using Xhance, 1 spray per nostril twice daily. She experienced "random" wheezing at nighttime a few weeks ago. Otherwise she has not experienced lower respiratory symptoms.  She is using [place] once daily without adequate control. Sunshyne reports that she was bitten or stung by an insect on the back of the neck a few days ago.  She believes there may be slight swelling of lymph nodes in her neck.  Assessment and plan: Seasonal and perennial allergic rhinitis  Continue appropriate allergen avoidance measures, immunotherapy injections as prescribed, montelukast 10 mg daily, and levocetirizine (Xyzal) as needed.  The montelukast boxed warning has been discussed.  Increase Xhance to 2 actuations per nostril twice daily if needed.  Nasal saline lavage (NeilMed) has been recommended as needed and prior to medicated nasal sprays along with instructions for proper administration.  Allergic conjunctivitis  Treatment plan as outlined above for allergic rhinitis.  A refill prescription will be provided for Pazeo, one drop per eye daily as needed.  I have also recommended eye lubricant drops (i.e., Natural Tears) as needed.  Coughing/wheezing  Continue montelukast has been prescribed (as above) and albuterol HFA, 1 to 2 inhalations every 4-6 hours if needed.  Subjective and objective measures of pulmonary function will be followed and the treatment plan will be adjusted accordingly.  Atopic dermatitis  Continue appropriate skin care measures.  Eucrisa (crisaborole) 2% ointment twice a day to affected areas as needed.  For more stubborn eczema, apply betamethasone to affected areas as needed.  Insect sting  Apply hydrocortisone 1% and/or ice packs as needed.  Continue levocetirizine.  Monitor the bite/sting area and contact primary care physician if the area becomes hot, red, tender, or if there is progression of lymph node swelling.   Meds ordered this encounter  Medications  . Olopatadine HCl (PAZEO) 0.7 % SOLN    Sig: Place 1 drop into both eyes as needed.    Dispense:  1 Bottle    Refill:  5    Diagnostics: None.   Physical examination: Physical Exam Not obtained as encounter was done via  telephone.   The following portions of the patient's history were reviewed and updated as appropriate:  allergies, current medications, past family history, past medical history, past social history, past surgical history and problem list.  Allergies as of 04/19/2019      Reactions   Sulfa Antibiotics Itching, Swelling, Rash   FEVER   Tape Itching, Rash      Medication List       Accurate as of Apr 19, 2019 12:07 PM. If you have any questions, ask your nurse or doctor.        STOP taking these medications   cetirizine 10 MG tablet Commonly known as:  ZYRTEC Stopped by:  Edmonia Lynch, MD     TAKE these medications   acetaminophen 500 MG tablet Commonly known as:  TYLENOL Take 1,000 mg by mouth every 6 (six) hours as needed for headache.   albuterol 108 (90 Base) MCG/ACT inhaler Commonly known as:  VENTOLIN HFA Inhale 2 puffs into the lungs every 4 (four) hours as needed for wheezing or shortness of breath.   BIOTIN PO Take 1 tablet by mouth daily.   calcium carbonate 500 MG chewable tablet Commonly known as:  TUMS - dosed in mg elemental calcium Chew 2-3 tablets by mouth daily as needed for indigestion or heartburn.   Centrum Silver 50+Women Tabs Take 1 tablet by mouth daily.   Crisaborole 2 % Oint Commonly known as:  Nepal Apply 1 application topically 2 (two) times daily as needed (to red itchy areas).   Epinastine HCl 0.05 % ophthalmic solution Place 1 drop into both eyes 2 (two) times daily as needed.   EPINEPHrine 0.3 mg/0.3 mL Soaj injection Commonly known as:  Auvi-Q Inject 0.3 mLs (0.3 mg total) into the muscle as needed for anaphylaxis.   Fluticasone Propionate 93 MCG/ACT Exhu Commonly known as:  Xhance Place 2 sprays into the nose 2 (two) times daily. What changed:  how much to take   ibuprofen 200 MG tablet Commonly known as:  ADVIL Take 200 mg by mouth every 6 (six) hours as needed for moderate pain.   levocetirizine 5 MG tablet Commonly known as:  XYZAL Take 1 tablet (5 mg total) by mouth every evening.   levothyroxine 137 MCG tablet  Commonly known as:  SYNTHROID Take 137 mcg by mouth daily before breakfast.   montelukast 10 MG tablet Commonly known as:  SINGULAIR Take 1 tablet (10 mg total) by mouth at bedtime.   Olopatadine HCl 0.7 % Soln Commonly known as:  Pazeo Place 1 drop into both eyes as needed. What changed:    when to take this  reasons to take this  Another medication with the same name was removed. Continue taking this medication, and follow the directions you see here. Changed by:  Edmonia Lynch, MD   PARoxetine 20 MG tablet Commonly known as:  PAXIL Take 5 mg by mouth at bedtime.   vitamin C 1000 MG tablet Take 1,000 mg by mouth daily.   Vitamin D3 50 MCG (2000 UT) capsule Take 6,000 Units by mouth daily.       Allergies  Allergen Reactions  . Sulfa Antibiotics Itching, Swelling and Rash    FEVER  . Tape Itching and Rash   Review of systems: Review of systems negative except as noted in HPI / PMHx or noted below: Constitutional: Negative.  HENT: Negative.   Eyes: Negative.  Respiratory: Negative.   Cardiovascular: Negative.  Gastrointestinal: Negative.  Genitourinary: Negative.  Musculoskeletal: Negative.  Neurological: Negative.  Endo/Heme/Allergies: Negative.  Cutaneous: Negative.  Past Medical History:  Diagnosis Date  . Anxiety   . Arthritis   . Breast cancer ([place]) 09/15/2016   Right breast upper inner  . Complication of anesthesia    during colonoscopy pt. could feel what was going on,stated she screamed  . Eczema   . GERD (gastroesophageal reflux disease)   . Hypothyroidism   . Mitral valve prolapse     Family History  Problem Relation Age of Onset  . COPD Mother   . Urticaria Sister   . Eczema Maternal Uncle   . Allergic rhinitis Neg Hx   . Angioedema Neg Hx   . Asthma Neg Hx   . Immunodeficiency Neg Hx     Social History   Socioeconomic History  . Marital status: Married    Spouse name: Not on file  . Number of children: Not on file  .  Years of education: Not on file  . Highest education level: Not on file  Occupational History  . Not on file  Social Needs  . Financial resource strain: Not on file  . Food insecurity:    Worry: Not on file    Inability: Not on file  . Transportation needs:    Medical: Not on file    Non-medical: Not on file  Tobacco Use  . Smoking status: Former Smoker    Packs/day: 0.50    Years: 3.00    Pack years: 1.50    Last attempt to quit: 11/19/1988    Years since quitting: 30.4  . Smokeless tobacco: Never Used  Substance and Sexual Activity  . Alcohol use: Yes    Comment: monthly  . Drug use: No  . Sexual activity: Not on file  Lifestyle  . Physical activity:    Days per week: Not on file    Minutes per session: Not on file  . Stress: Not on file  Relationships  . Social connections:    Talks on phone: Not on file    Gets together: Not on file    Attends religious service: Not on file    Active member of club or organization: Not on file    Attends meetings of clubs or organizations: Not on file    Relationship status: Not on file  . Intimate partner violence:    Fear of current or ex partner: Not on file    Emotionally abused: Not on file    Physically abused: Not on file    Forced sexual activity: Not on file  Other Topics Concern  . Not on file  Social History Narrative  . Not on file    Previous notes and tests were reviewed.  I discussed the assessment and treatment plan with the patient. The patient was provided an opportunity to ask questions and all were answered. The patient agreed with the plan and demonstrated an understanding of the instructions.   The patient was advised to call back or seek an in-person evaluation if the symptoms worsen or if the condition fails to improve as anticipated.  I provided 34 minutes of non-face-to-face time during this encounter.  I appreciate the opportunity to take part in Charlie's care. Please do not hesitate to  contact me with questions.  Sincerely,   R. Edgar Frisk, MD

## 2019-04-19 NOTE — Assessment & Plan Note (Signed)
   Continue montelukast has been prescribed (as above) and albuterol HFA, 1 to 2 inhalations every 4-6 hours if needed.  Subjective and objective measures of pulmonary function will be followed and the treatment plan will be adjusted accordingly.

## 2019-04-19 NOTE — Patient Instructions (Signed)
Seasonal and perennial allergic rhinitis  Continue appropriate allergen avoidance measures, immunotherapy injections as prescribed, montelukast 10 mg daily, and levocetirizine (Xyzal) as needed.  The montelukast boxed warning has been discussed.  Increase Xhance to 2 actuations per nostril twice daily if needed.  Nasal saline lavage (NeilMed) has been recommended as needed and prior to medicated nasal sprays along with instructions for proper administration.  Allergic conjunctivitis  Treatment plan as outlined above for allergic rhinitis.  A refill prescription will be provided for Pazeo, one drop per eye daily as needed.  I have also recommended eye lubricant drops (i.e., Natural Tears) as needed.  Coughing/wheezing  Continue montelukast has been prescribed (as above) and albuterol HFA, 1 to 2 inhalations every 4-6 hours if needed.  Subjective and objective measures of pulmonary function will be followed and the treatment plan will be adjusted accordingly.  Atopic dermatitis  Continue appropriate skin care measures.  Eucrisa (crisaborole) 2% ointment twice a day to affected areas as needed.  For more stubborn eczema, apply betamethasone to affected areas as needed.  Insect sting  Apply hydrocortisone 1% and/or ice packs as needed.  Continue levocetirizine.  Monitor the bite/sting area and contact primary care physician if the area becomes hot, red, tender, or if there is progression of lymph node swelling.   Return in about 4 months (around 08/20/2019), or if symptoms worsen or fail to improve.

## 2019-04-19 NOTE — Assessment & Plan Note (Signed)
   Continue appropriate skin care measures.  Eucrisa (crisaborole) 2% ointment twice a day to affected areas as needed.  For more stubborn eczema, apply betamethasone to affected areas as needed.

## 2019-04-19 NOTE — Assessment & Plan Note (Signed)
   Treatment plan as outlined above for allergic rhinitis.  A refill prescription will be provided for Pazeo, one drop per eye daily as needed.  I have also recommended eye lubricant drops (i.e., Natural Tears) as needed.

## 2019-04-19 NOTE — Assessment & Plan Note (Addendum)
   Apply hydrocortisone 1% and/or ice packs as needed.  Continue levocetirizine.  Monitor the bite/sting area and contact primary care physician if the area becomes hot, red, tender, or if there is progression of lymph node swelling.

## 2019-04-19 NOTE — Assessment & Plan Note (Signed)
   Continue appropriate allergen avoidance measures, immunotherapy injections as prescribed, montelukast 10 mg daily, and levocetirizine (Xyzal) as needed.  The montelukast boxed warning has been discussed.  Increase Xhance to 2 actuations per nostril twice daily if needed.  Nasal saline lavage (NeilMed) has been recommended as needed and prior to medicated nasal sprays along with instructions for proper administration.

## 2019-04-25 ENCOUNTER — Ambulatory Visit (INDEPENDENT_AMBULATORY_CARE_PROVIDER_SITE_OTHER): Payer: PRIVATE HEALTH INSURANCE

## 2019-04-25 DIAGNOSIS — J309 Allergic rhinitis, unspecified: Secondary | ICD-10-CM | POA: Diagnosis not present

## 2019-05-02 ENCOUNTER — Ambulatory Visit (INDEPENDENT_AMBULATORY_CARE_PROVIDER_SITE_OTHER): Payer: PRIVATE HEALTH INSURANCE

## 2019-05-02 DIAGNOSIS — J309 Allergic rhinitis, unspecified: Secondary | ICD-10-CM

## 2019-05-18 ENCOUNTER — Ambulatory Visit (INDEPENDENT_AMBULATORY_CARE_PROVIDER_SITE_OTHER): Payer: PRIVATE HEALTH INSURANCE

## 2019-05-18 DIAGNOSIS — J309 Allergic rhinitis, unspecified: Secondary | ICD-10-CM | POA: Diagnosis not present

## 2019-05-30 ENCOUNTER — Ambulatory Visit (INDEPENDENT_AMBULATORY_CARE_PROVIDER_SITE_OTHER): Payer: PRIVATE HEALTH INSURANCE

## 2019-05-30 DIAGNOSIS — J309 Allergic rhinitis, unspecified: Secondary | ICD-10-CM

## 2019-06-08 ENCOUNTER — Ambulatory Visit (INDEPENDENT_AMBULATORY_CARE_PROVIDER_SITE_OTHER): Payer: PRIVATE HEALTH INSURANCE

## 2019-06-08 DIAGNOSIS — J309 Allergic rhinitis, unspecified: Secondary | ICD-10-CM

## 2019-06-12 ENCOUNTER — Ambulatory Visit (INDEPENDENT_AMBULATORY_CARE_PROVIDER_SITE_OTHER): Payer: PRIVATE HEALTH INSURANCE

## 2019-06-12 DIAGNOSIS — J309 Allergic rhinitis, unspecified: Secondary | ICD-10-CM | POA: Diagnosis not present

## 2019-06-22 ENCOUNTER — Ambulatory Visit (INDEPENDENT_AMBULATORY_CARE_PROVIDER_SITE_OTHER): Payer: PRIVATE HEALTH INSURANCE

## 2019-06-22 DIAGNOSIS — J309 Allergic rhinitis, unspecified: Secondary | ICD-10-CM

## 2019-06-26 ENCOUNTER — Ambulatory Visit (INDEPENDENT_AMBULATORY_CARE_PROVIDER_SITE_OTHER): Payer: PRIVATE HEALTH INSURANCE

## 2019-06-26 DIAGNOSIS — J309 Allergic rhinitis, unspecified: Secondary | ICD-10-CM | POA: Diagnosis not present

## 2019-06-27 ENCOUNTER — Telehealth: Payer: Self-pay | Admitting: Allergy & Immunology

## 2019-06-27 NOTE — Telephone Encounter (Signed)
Late Entry (received call on the evening of 06/26/19): I received a call from Patty Thompson reporting a delayed large local reaction on her arm. I recommended taking an extra antihistamine and putting on a cold compress. She did not have any other systemic symptoms. I asked her to talk to the nurse next week to let her know which arm is was so that we can make dose adjustments.   Salvatore Marvel, MD [facility]

## 2019-06-28 NOTE — Telephone Encounter (Signed)
I confirmed with the patient that it was her left arm which had a delayed reaction.  We will make immunotherapy changes accordingly.

## 2019-06-28 NOTE — Telephone Encounter (Signed)
Noted in allergen flow sheet

## 2019-07-05 ENCOUNTER — Ambulatory Visit (INDEPENDENT_AMBULATORY_CARE_PROVIDER_SITE_OTHER): Payer: PRIVATE HEALTH INSURANCE

## 2019-07-05 DIAGNOSIS — J309 Allergic rhinitis, unspecified: Secondary | ICD-10-CM | POA: Diagnosis not present

## 2019-07-11 ENCOUNTER — Ambulatory Visit (INDEPENDENT_AMBULATORY_CARE_PROVIDER_SITE_OTHER): Payer: PRIVATE HEALTH INSURANCE

## 2019-07-11 DIAGNOSIS — J309 Allergic rhinitis, unspecified: Secondary | ICD-10-CM

## 2019-07-19 ENCOUNTER — Ambulatory Visit (INDEPENDENT_AMBULATORY_CARE_PROVIDER_SITE_OTHER): Payer: PRIVATE HEALTH INSURANCE

## 2019-07-19 DIAGNOSIS — J309 Allergic rhinitis, unspecified: Secondary | ICD-10-CM

## 2019-07-23 ENCOUNTER — Other Ambulatory Visit: Payer: Self-pay

## 2019-07-23 ENCOUNTER — Telehealth: Payer: Self-pay | Admitting: *Deleted

## 2019-07-23 MED ORDER — MONTELUKAST SODIUM 10 MG PO TABS: 10.0000 mg | ORAL_TABLET | Freq: Every day | ORAL | 3 refills | Status: DC

## 2019-07-23 NOTE — Telephone Encounter (Signed)
Refills sent in

## 2019-07-23 NOTE — Telephone Encounter (Signed)
PT NEEDS REFILL FOR MONTELUKAST SENT TO [facility] [address] IN [place]

## 2019-07-25 ENCOUNTER — Ambulatory Visit (INDEPENDENT_AMBULATORY_CARE_PROVIDER_SITE_OTHER): Payer: PRIVATE HEALTH INSURANCE

## 2019-07-25 DIAGNOSIS — J309 Allergic rhinitis, unspecified: Secondary | ICD-10-CM | POA: Diagnosis not present

## 2019-08-02 ENCOUNTER — Ambulatory Visit (INDEPENDENT_AMBULATORY_CARE_PROVIDER_SITE_OTHER): Payer: PRIVATE HEALTH INSURANCE

## 2019-08-02 DIAGNOSIS — J309 Allergic rhinitis, unspecified: Secondary | ICD-10-CM | POA: Diagnosis not present

## 2019-08-09 ENCOUNTER — Ambulatory Visit (INDEPENDENT_AMBULATORY_CARE_PROVIDER_SITE_OTHER): Payer: PRIVATE HEALTH INSURANCE

## 2019-08-09 DIAGNOSIS — J309 Allergic rhinitis, unspecified: Secondary | ICD-10-CM

## 2019-08-13 ENCOUNTER — Ambulatory Visit (INDEPENDENT_AMBULATORY_CARE_PROVIDER_SITE_OTHER): Payer: PRIVATE HEALTH INSURANCE

## 2019-08-13 DIAGNOSIS — J309 Allergic rhinitis, unspecified: Secondary | ICD-10-CM | POA: Diagnosis not present

## 2019-08-15 ENCOUNTER — Ambulatory Visit: Payer: PRIVATE HEALTH INSURANCE | Admitting: Allergy and Immunology

## 2019-08-24 ENCOUNTER — Ambulatory Visit (INDEPENDENT_AMBULATORY_CARE_PROVIDER_SITE_OTHER): Payer: PRIVATE HEALTH INSURANCE

## 2019-08-24 DIAGNOSIS — J309 Allergic rhinitis, unspecified: Secondary | ICD-10-CM

## 2019-08-30 ENCOUNTER — Ambulatory Visit (INDEPENDENT_AMBULATORY_CARE_PROVIDER_SITE_OTHER): Payer: PRIVATE HEALTH INSURANCE

## 2019-08-30 DIAGNOSIS — J309 Allergic rhinitis, unspecified: Secondary | ICD-10-CM

## 2019-09-10 ENCOUNTER — Ambulatory Visit (INDEPENDENT_AMBULATORY_CARE_PROVIDER_SITE_OTHER): Payer: PRIVATE HEALTH INSURANCE

## 2019-09-10 DIAGNOSIS — J309 Allergic rhinitis, unspecified: Secondary | ICD-10-CM | POA: Diagnosis not present

## 2019-09-18 ENCOUNTER — Ambulatory Visit (INDEPENDENT_AMBULATORY_CARE_PROVIDER_SITE_OTHER): Payer: PRIVATE HEALTH INSURANCE

## 2019-09-18 DIAGNOSIS — J309 Allergic rhinitis, unspecified: Secondary | ICD-10-CM

## 2019-09-20 ENCOUNTER — Other Ambulatory Visit: Payer: Self-pay

## 2019-09-20 ENCOUNTER — Encounter: Payer: Self-pay | Admitting: Family Medicine

## 2019-09-20 ENCOUNTER — Ambulatory Visit (INDEPENDENT_AMBULATORY_CARE_PROVIDER_SITE_OTHER): Payer: PRIVATE HEALTH INSURANCE | Admitting: Family Medicine

## 2019-09-20 DIAGNOSIS — J3089 Other allergic rhinitis: Secondary | ICD-10-CM

## 2019-09-20 DIAGNOSIS — J330 Polyp of nasal cavity: Secondary | ICD-10-CM | POA: Insufficient documentation

## 2019-09-20 DIAGNOSIS — L2089 Other atopic dermatitis: Secondary | ICD-10-CM

## 2019-09-20 DIAGNOSIS — R062 Wheezing: Secondary | ICD-10-CM | POA: Diagnosis not present

## 2019-09-20 DIAGNOSIS — H1013 Acute atopic conjunctivitis, bilateral: Secondary | ICD-10-CM | POA: Diagnosis not present

## 2019-09-20 DIAGNOSIS — K219 Gastro-esophageal reflux disease without esophagitis: Secondary | ICD-10-CM | POA: Insufficient documentation

## 2019-09-20 MED ORDER — MONTELUKAST SODIUM 10 MG PO TABS: 10.0000 mg | ORAL_TABLET | Freq: Every day | ORAL | 3 refills | Status: DC

## 2019-09-20 MED ORDER — LEVOCETIRIZINE DIHYDROCHLORIDE 5 MG PO TABS: 5.0000 mg | ORAL_TABLET | Freq: Every evening | ORAL | 5 refills | Status: AC

## 2019-09-20 NOTE — Progress Notes (Addendum)
RE: Ross Szabo MRN: [id number] DOB: 1966-07-28 Date of Telemedicine Visit: 09/20/2019  Referring provider: Drake Leach, MD Primary care provider: Drake Leach, MD  Chief Complaint: Allergies   Telemedicine Follow Up Visit via Telephone: I connected with [name] for a follow up on 09/20/19 by telephone and verified that I am speaking with the correct person using two identifiers.   I discussed the limitations, risks, security and privacy concerns of performing an evaluation and management service by telephone and the availability of in person appointments. I also discussed with the patient that there may be a patient responsible charge related to this service. The patient expressed understanding and agreed to proceed.  Patient is at home Provider is at the office.  Visit start time: 3:31 Visit end time: 4:04  History of Present Illness: She is a 53 y.o. female, who is being followed for allergic rhinitis on allergen immunotherapy, nasal polyposis, allergic conjunctivitis, atopic dermatitis, wheeze, and reflux. Her previous allergy office visit was on 04/19/2019 with Dr. Verlin Fester. At today's visit, she reports allergic rhinitis has been moderately well controlled with symptoms including nasal congestion and thick post nasal drainage. She reports nasal polyposis has been well controlled with Xhance 2 sprays twice a day and saline nasal rinses.  She reports allergic conjunctivitis has been moderately well controlled with occasional red and puffy eyes occurring in the morning for which she uses Pazeo with relief of symptoms.  Atopic dermatitis is reported as well controlled with a daily moisturizer and Eucrisa twice a day as needed.  She reports 1 red itchy area occurring between her breasts for which she uses a daily moisturizing lotion and occasional betamethasone with relief of symptoms.  Reflux is reported is not well controlled with heartburn occurring several  times a week for the last 5 years for which she takes Tums with resolution of symptoms.  She is currently taking levothyroxine for hypothyroidism.  Her breathing is reported as moderately well controlled with no shortness of breath, occasional wheeze, and occasional cough that is sometimes dry and sometimes produces clear to yellow mucus.  She reports chest tightness that occurs in the fall when the pollen is high.  She uses her albuterol infrequently and is not sure if it relieves the chest tightness experienced in the fall.  She continues montelukast once a day.  She does report that about 4 times a year she gets a chest pain for which she sees her primary care provider who thinks it is an esophageal spasm.  Her current medications are listed in the chart.  Assessment and Plan: Allergic rhinitis with nasal polyposis Continue Xhance 2 sprays in each nostril twice a day for a stuffy nose and for the nasal polyp Continue saline nasal rinses as needed for nasal symptoms. For thick post nasal drainage, begin Mucinex [phone] mg twice a day and increase hydration to thin mucus  Allergic conjunctivitis Continue Pazeo 1 drop in each eye once a day as needed for red, itchy eyes  Wheeze Continue montelukast 10 mg once a day to prevent cough or wheeze Continue albuterol 2 puffs every 4 hours as needed for cough or wheeze.  Reflux Continue dietary and lifestyle modifications as listed below  Atopic dermatitis Continue a daily moisturizing routine as you have been Continue Eucrisa twice a day to red itchy areas as needed For stubborn red itchy areas below your face or neck continue betamethasone twice a day as needed  Call the clinic if this treatment plan is  not working well for you  Follow up in the clinic in 4 months or sooner if needed.  Return in about 4 months (around 01/20/2020), or if symptoms worsen or fail to improve.  Meds ordered this encounter  Medications   montelukast (SINGULAIR)  10 MG tablet    Sig: Take 1 tablet (10 mg total) by mouth at bedtime.    Dispense:  30 tablet    Refill:  3   levocetirizine (XYZAL) 5 MG tablet    Sig: Take 1 tablet (5 mg total) by mouth every evening.    Dispense:  30 tablet    Refill:  5    Medication List:  Current Outpatient Medications  Medication Sig Dispense Refill   acetaminophen (TYLENOL) 500 MG tablet Take 1,000 mg by mouth every 6 (six) hours as needed for headache.     albuterol (PROVENTIL HFA;VENTOLIN HFA) 108 (90 Base) MCG/ACT inhaler Inhale 2 puffs into the lungs every 4 (four) hours as needed for wheezing or shortness of breath. 18 g 1   Ascorbic Acid (VITAMIN C) 1000 MG tablet Take 1,000 mg by mouth daily.     calcium carbonate (TUMS - DOSED IN MG ELEMENTAL CALCIUM) 500 MG chewable tablet Chew 2-3 tablets by mouth daily as needed for indigestion or heartburn.     Cholecalciferol (VITAMIN D3) 2000 units capsule Take 6,000 Units by mouth daily.     Crisaborole (EUCRISA) 2 % OINT Apply 1 application topically 2 (two) times daily as needed (to red itchy areas). 100 g 5   EPINEPHrine (AUVI-Q) 0.3 mg/0.3 mL IJ SOAJ injection Inject 0.3 mLs (0.3 mg total) into the muscle as needed for anaphylaxis. 2 Device 1   Fluticasone Propionate (XHANCE) 93 MCG/ACT EXHU Place 2 sprays into the nose 2 (two) times daily. (Patient taking differently: Place 1 spray into the nose 2 (two) times daily. ) 32 mL 5   ibuprofen (ADVIL,MOTRIN) 200 MG tablet Take 200 mg by mouth every 6 (six) hours as needed for moderate pain.     levocetirizine (XYZAL) 5 MG tablet Take 1 tablet (5 mg total) by mouth every evening. 30 tablet 5   levothyroxine (SYNTHROID, LEVOTHROID) 137 MCG tablet Take 137 mcg by mouth daily before breakfast.     montelukast (SINGULAIR) 10 MG tablet Take 1 tablet (10 mg total) by mouth at bedtime. 30 tablet 3   Multiple Vitamins-Minerals (CENTRUM SILVER 50+WOMEN) TABS Take 1 tablet by mouth daily.     Olopatadine HCl  (PAZEO) 0.7 % SOLN Place 1 drop into both eyes as needed. 1 Bottle 5   PARoxetine (PAXIL) 20 MG tablet Take 5 mg by mouth at bedtime.     Turmeric (QC TUMERIC COMPLEX) 500 MG CAPS Take 500 mg by mouth daily.     BIOTIN PO Take 1 tablet by mouth daily.     No current facility-administered medications for this visit.    Allergies: Allergies  Allergen Reactions   Sulfa Antibiotics Itching, Swelling and Rash    FEVER   Tape Itching and Rash   I reviewed her past medical history, social history, family history, and environmental history and no significant changes have been reported from previous visit on 04/19/2019.  Objective: Physical Exam Not obtained as encounter was done via telephone.   Previous notes and tests were reviewed.  I discussed the assessment and treatment plan with the patient. The patient was provided an opportunity to ask questions and all were answered. The patient agreed with the plan and  demonstrated an understanding of the instructions.   The patient was advised to call back or seek an in-person evaluation if the symptoms worsen or if the condition fails to improve as anticipated.  I provided 33 minutes of non-face-to-face time during this encounter.  It was my pleasure to participate in Marcus Tanner-Harron's care today. Please feel free to contact me with any questions or concerns.   Sincerely,  Gareth Morgan, FNP  ________________________________________________  I have provided oversight concerning [facility]'s evaluation and treatment of this patient's health issues addressed during today's encounter.  I agree with the assessment and therapeutic plan as outlined in the note.   Signed,   R Edgar Frisk, MD

## 2019-09-20 NOTE — Patient Instructions (Addendum)
Allergic rhinitis with nasal polyposis Continue Xhance 2 sprays in each nostril twice a day for a stuffy nose and for the nasal polyp Continue saline nasal rinses as needed for nasal symptoms. For thick post nasal drainage, begin Mucinex [phone] mg twice a day and increase hydration to thin mucus  Allergic conjunctivitis Continue Pazeo 1 drop in each eye once a day as needed for red, itchy eyes  Wheeze Continue montelukast 10 mg once a day to prevent cough or wheeze Continue albuterol 2 puffs every 4 hours as needed for cough or wheeze.  Reflux Continue dietary and lifestyle modifications as listed below  Atopic dermatitis Continue a daily moisturizing routine as you have been Continue Eucrisa twice a day to red itchy areas as needed For stubborn red itchy areas below your face or neck continue betamethasone twice a day as needed  Call the clinic if this treatment plan is not working well for you  Follow up in the clinic in 4 months or sooner if needed.   Lifestyle Changes for Controlling GERD When you have GERD, stomach acid feels as if it's backing up toward your mouth. Whether or not you take medication to control your GERD, your symptoms can often be improved with lifestyle changes.   Raise Your Head  Reflux is more likely to strike when you're lying down flat, because stomach fluid can  flow backward more easily. Raising the head of your bed 4-6 inches can help. To do this:  Slide blocks or books under the legs at the head of your bed. Or, place a wedge under  the mattress. Many foam stores can make a suitable wedge for you. The wedge  should run from your waist to the top of your head.  Don't just prop your head on several pillows. This increases pressure on your  stomach. It can make GERD worse.  Watch Your Eating Habits Certain foods may increase the acid in your stomach or relax the lower esophageal sphincter, making GERD more likely. It's best to avoid the  following:  Coffee, tea, and carbonated drinks (with and without caffeine)  Fatty, fried, or spicy food  Mint, chocolate, onions, and tomatoes  Any other foods that seem to irritate your stomach or cause you pain  Relieve the Pressure  Eat smaller meals, even if you have to eat more often.  Don't lie down right after you eat. Wait a few hours for your stomach to empty.  Avoid tight belts and tight-fitting clothes.  Lose excess weight.  Tobacco and Alcohol  Avoid smoking tobacco and drinking alcohol. They can make GERD symptoms worse.

## 2019-09-24 ENCOUNTER — Telehealth: Payer: Self-pay

## 2019-09-24 NOTE — Telephone Encounter (Signed)
Ambs, Kathrine Cords, FNP  [facility]        Can you please call this patient and let her know that reflux medications (even TUMS) change the way her thyroid medication is absorbed. She will need to get reflux medications from her PCP so that they can monitor her TSH levels. Please have her call with any questions thank you    Lm for pt to call us back

## 2019-09-24 NOTE — Telephone Encounter (Signed)
Ambs, Kathrine Cords, FNP  [facility]        Can you please call this patient and let her know that the medications that control reflux interact with her thyroid medications. She should get the reflux medications from her PCP so they can monitor how the medications are going to work together. Thank you    Left message for pt to call our office about this.

## 2019-09-25 ENCOUNTER — Ambulatory Visit (INDEPENDENT_AMBULATORY_CARE_PROVIDER_SITE_OTHER): Payer: PRIVATE HEALTH INSURANCE

## 2019-09-25 DIAGNOSIS — J309 Allergic rhinitis, unspecified: Secondary | ICD-10-CM | POA: Diagnosis not present

## 2019-09-25 NOTE — Telephone Encounter (Signed)
Spoke with pt in person about the recommendations of anne ambs fnp. She stated understanding and will be calling her pcp to get them to write the gerd medication rx

## 2019-10-03 ENCOUNTER — Ambulatory Visit (INDEPENDENT_AMBULATORY_CARE_PROVIDER_SITE_OTHER): Payer: PRIVATE HEALTH INSURANCE

## 2019-10-03 DIAGNOSIS — J309 Allergic rhinitis, unspecified: Secondary | ICD-10-CM | POA: Diagnosis not present

## 2019-10-11 ENCOUNTER — Ambulatory Visit (INDEPENDENT_AMBULATORY_CARE_PROVIDER_SITE_OTHER): Payer: PRIVATE HEALTH INSURANCE

## 2019-10-11 DIAGNOSIS — J309 Allergic rhinitis, unspecified: Secondary | ICD-10-CM | POA: Diagnosis not present

## 2019-10-16 ENCOUNTER — Ambulatory Visit (INDEPENDENT_AMBULATORY_CARE_PROVIDER_SITE_OTHER): Payer: PRIVATE HEALTH INSURANCE

## 2019-10-16 DIAGNOSIS — J309 Allergic rhinitis, unspecified: Secondary | ICD-10-CM

## 2019-10-31 ENCOUNTER — Ambulatory Visit (INDEPENDENT_AMBULATORY_CARE_PROVIDER_SITE_OTHER): Payer: PRIVATE HEALTH INSURANCE

## 2019-10-31 DIAGNOSIS — J309 Allergic rhinitis, unspecified: Secondary | ICD-10-CM | POA: Diagnosis not present

## 2019-11-07 ENCOUNTER — Other Ambulatory Visit: Payer: Self-pay | Admitting: Family Medicine

## 2019-11-13 ENCOUNTER — Ambulatory Visit (INDEPENDENT_AMBULATORY_CARE_PROVIDER_SITE_OTHER): Payer: PRIVATE HEALTH INSURANCE

## 2019-11-13 DIAGNOSIS — J309 Allergic rhinitis, unspecified: Secondary | ICD-10-CM

## 2019-11-20 ENCOUNTER — Ambulatory Visit (INDEPENDENT_AMBULATORY_CARE_PROVIDER_SITE_OTHER): Payer: PRIVATE HEALTH INSURANCE

## 2019-11-20 DIAGNOSIS — J309 Allergic rhinitis, unspecified: Secondary | ICD-10-CM | POA: Diagnosis not present

## 2019-11-26 ENCOUNTER — Ambulatory Visit (INDEPENDENT_AMBULATORY_CARE_PROVIDER_SITE_OTHER): Payer: PRIVATE HEALTH INSURANCE

## 2019-11-26 DIAGNOSIS — J309 Allergic rhinitis, unspecified: Secondary | ICD-10-CM

## 2019-12-11 ENCOUNTER — Ambulatory Visit (INDEPENDENT_AMBULATORY_CARE_PROVIDER_SITE_OTHER): Payer: PRIVATE HEALTH INSURANCE

## 2019-12-11 DIAGNOSIS — J309 Allergic rhinitis, unspecified: Secondary | ICD-10-CM

## 2019-12-20 ENCOUNTER — Ambulatory Visit (INDEPENDENT_AMBULATORY_CARE_PROVIDER_SITE_OTHER): Payer: PRIVATE HEALTH INSURANCE

## 2019-12-20 DIAGNOSIS — J309 Allergic rhinitis, unspecified: Secondary | ICD-10-CM

## 2019-12-25 ENCOUNTER — Ambulatory Visit (INDEPENDENT_AMBULATORY_CARE_PROVIDER_SITE_OTHER): Payer: PRIVATE HEALTH INSURANCE

## 2019-12-25 DIAGNOSIS — J309 Allergic rhinitis, unspecified: Secondary | ICD-10-CM

## 2020-01-03 ENCOUNTER — Ambulatory Visit (INDEPENDENT_AMBULATORY_CARE_PROVIDER_SITE_OTHER): Payer: PRIVATE HEALTH INSURANCE

## 2020-01-03 DIAGNOSIS — J309 Allergic rhinitis, unspecified: Secondary | ICD-10-CM

## 2020-01-07 ENCOUNTER — Ambulatory Visit (INDEPENDENT_AMBULATORY_CARE_PROVIDER_SITE_OTHER): Payer: PRIVATE HEALTH INSURANCE

## 2020-01-07 DIAGNOSIS — J309 Allergic rhinitis, unspecified: Secondary | ICD-10-CM

## 2020-01-14 ENCOUNTER — Ambulatory Visit (INDEPENDENT_AMBULATORY_CARE_PROVIDER_SITE_OTHER): Payer: PRIVATE HEALTH INSURANCE

## 2020-01-14 DIAGNOSIS — J309 Allergic rhinitis, unspecified: Secondary | ICD-10-CM

## 2020-01-22 ENCOUNTER — Ambulatory Visit (INDEPENDENT_AMBULATORY_CARE_PROVIDER_SITE_OTHER): Payer: PRIVATE HEALTH INSURANCE

## 2020-01-22 DIAGNOSIS — J309 Allergic rhinitis, unspecified: Secondary | ICD-10-CM | POA: Diagnosis not present

## 2020-02-04 ENCOUNTER — Ambulatory Visit (INDEPENDENT_AMBULATORY_CARE_PROVIDER_SITE_OTHER): Payer: PRIVATE HEALTH INSURANCE

## 2020-02-04 DIAGNOSIS — J309 Allergic rhinitis, unspecified: Secondary | ICD-10-CM | POA: Diagnosis not present

## 2020-02-04 NOTE — Progress Notes (Signed)
VIALS EXP 02-03-21

## 2020-02-05 DIAGNOSIS — J301 Allergic rhinitis due to pollen: Secondary | ICD-10-CM

## 2020-02-06 DIAGNOSIS — J3089 Other allergic rhinitis: Secondary | ICD-10-CM

## 2020-02-13 ENCOUNTER — Other Ambulatory Visit: Payer: Self-pay | Admitting: Allergy and Immunology

## 2020-02-14 ENCOUNTER — Ambulatory Visit (INDEPENDENT_AMBULATORY_CARE_PROVIDER_SITE_OTHER): Payer: 59 | Admitting: *Deleted

## 2020-02-14 DIAGNOSIS — J309 Allergic rhinitis, unspecified: Secondary | ICD-10-CM | POA: Diagnosis not present

## 2020-02-20 ENCOUNTER — Ambulatory Visit (INDEPENDENT_AMBULATORY_CARE_PROVIDER_SITE_OTHER): Payer: 59 | Admitting: *Deleted

## 2020-02-20 DIAGNOSIS — J309 Allergic rhinitis, unspecified: Secondary | ICD-10-CM

## 2020-03-06 ENCOUNTER — Ambulatory Visit (INDEPENDENT_AMBULATORY_CARE_PROVIDER_SITE_OTHER): Payer: 59

## 2020-03-06 DIAGNOSIS — J309 Allergic rhinitis, unspecified: Secondary | ICD-10-CM

## 2020-03-14 ENCOUNTER — Ambulatory Visit (INDEPENDENT_AMBULATORY_CARE_PROVIDER_SITE_OTHER): Payer: 59 | Admitting: *Deleted

## 2020-03-14 DIAGNOSIS — J309 Allergic rhinitis, unspecified: Secondary | ICD-10-CM | POA: Diagnosis not present

## 2020-03-19 ENCOUNTER — Other Ambulatory Visit: Payer: Self-pay

## 2020-03-19 ENCOUNTER — Encounter: Payer: Self-pay | Admitting: Family Medicine

## 2020-03-19 ENCOUNTER — Ambulatory Visit (INDEPENDENT_AMBULATORY_CARE_PROVIDER_SITE_OTHER): Payer: 59 | Admitting: Family Medicine

## 2020-03-19 VITALS — BP 92/60 | HR 61 | Temp 96.1°F | Resp 15 | Ht 66.5 in | Wt 165.4 lb

## 2020-03-19 DIAGNOSIS — J3089 Other allergic rhinitis: Secondary | ICD-10-CM | POA: Diagnosis not present

## 2020-03-19 DIAGNOSIS — L2089 Other atopic dermatitis: Secondary | ICD-10-CM | POA: Diagnosis not present

## 2020-03-19 DIAGNOSIS — J984 Other disorders of lung: Secondary | ICD-10-CM | POA: Diagnosis not present

## 2020-03-19 DIAGNOSIS — H1013 Acute atopic conjunctivitis, bilateral: Secondary | ICD-10-CM | POA: Diagnosis not present

## 2020-03-19 MED ORDER — AZELASTINE HCL 0.1 % NA SOLN: 2.0000 | Freq: Two times a day (BID) | NASAL | 5 refills | Status: DC | PRN

## 2020-03-19 MED ORDER — FLUTICASONE PROPIONATE 50 MCG/ACT NA SUSP: 2.0000 | Freq: Every day | NASAL | 5 refills | Status: DC | PRN

## 2020-03-19 MED ORDER — FLOVENT HFA 110 MCG/ACT IN AERO: INHALATION_SPRAY | RESPIRATORY_TRACT | 5 refills | Status: DC

## 2020-03-19 NOTE — Patient Instructions (Addendum)
Allergic rhinitis with nasal polyposis Stop Xhance  Begin Flonase 2 sprays in each nostril once a day for a stuffy nose and for the nasal polyp.  In the right nostril, point the applicator out toward the right ear. In the left nostril, point the applicator out toward the left ear Begin azelastine 2 sprays in each nostril twice a day as needed for a runny nose Continue saline nasal rinses as needed for nasal symptoms. Continue an over the counter antihistamine once a day as needed for a runny nose. Remember to rotate to a different antihistamine about every 3 months. Some examples of over the counter antihistamines include Zyrtec (cetirizine), Xyzal (levocetirizine), Allegra (fexofenadine), and Claritin (loratidine).  For thick post nasal drainage, begin Mucinex [phone] mg twice a day and increase hydration to thin mucus Continue allergen immunotherapy and have access to an epinephrine auto-injector set   Allergic conjunctivitis Continue Pazeo 1 drop in each eye once a day as needed for red, itchy eyes  Restrictive airway Continue montelukast 10 mg once a day to prevent cough or wheeze Continue albuterol 2 puffs every 4 hours as needed for cough or wheeze. For asthma flares, begin Flovent 110-2 puffs twice a day with a spacer for 2 weeks or until cough and wheeze free  Reflux Continue dietary and lifestyle modifications as listed below  Atopic dermatitis Continue a daily moisturizing routine as you have been Continue Eucrisa twice a day to red itchy areas as needed For stubborn red itchy areas below your face or neck continue betamethasone twice a day as needed  Call the clinic if this treatment plan is not working well for you  Follow up in the clinic in 6 months or sooner if needed.   Lifestyle Changes for Controlling GERD When you have GERD, stomach acid feels as if it's backing up toward your mouth. Whether or not you take medication to control your GERD, your symptoms can often  be improved with lifestyle changes.   Raise Your Head  Reflux is more likely to strike when you're lying down flat, because stomach fluid can  flow backward more easily. Raising the head of your bed 4-6 inches can help. To do this:  Slide blocks or books under the legs at the head of your bed. Or, place a wedge under  the mattress. Many foam stores can make a suitable wedge for you. The wedge  should run from your waist to the top of your head.  Don't just prop your head on several pillows. This increases pressure on your  stomach. It can make GERD worse.  Watch Your Eating Habits Certain foods may increase the acid in your stomach or relax the lower esophageal sphincter, making GERD more likely. It's best to avoid the following:  Coffee, tea, and carbonated drinks (with and without caffeine)  Fatty, fried, or spicy food  Mint, chocolate, onions, and tomatoes  Any other foods that seem to irritate your stomach or cause you pain  Relieve the Pressure  Eat smaller meals, even if you have to eat more often.  Don't lie down right after you eat. Wait a few hours for your stomach to empty.  Avoid tight belts and tight-fitting clothes.  Lose excess weight.  Tobacco and Alcohol  Avoid smoking tobacco and drinking alcohol. They can make GERD symptoms worse.

## 2020-03-19 NOTE — Addendum Note (Signed)
Addended by: Dara Hoyer on: 03/19/2020 04:08 PM   Modules accepted: Orders

## 2020-03-19 NOTE — Addendum Note (Signed)
Addended by: Orpah Greek D on: 03/19/2020 04:04 PM   Modules accepted: Orders

## 2020-03-19 NOTE — Progress Notes (Addendum)
[address] [place]  [address] Dept: [phone]  FOLLOW UP NOTE  Patient ID: Patty Thompson, female    DOB: 1966-09-07  Age: 54 y.o. MRN: [id number] Date of Office Visit: 03/19/2020  Assessment  Chief Complaint: Allergic Rhinitis  and Asthma  HPI Cailah Zelenka is a 54 year old female who presents to the clinic for follow-up visit.  She was last seen in this clinic on 09/10/2019 for evaluation of asthma, allergic rhinitis on immunotherapy, and allergic conjunctivitis.  At today's visit, she reports her asthma has been moderately well controlled with some chest tightness and cough producing clear mucus occurring about 3 weeks ago for which she used albuterol 2 times with relief of symptoms.  She denies shortness of breath, wheeze, and cough with activity or rest.  She continues montelukast 10 mg once a day and last use albuterol about 2 times this month.  Allergic rhinitis is reported as well controlled with intermittent congestion and occasional clear rhinorrhea occurring with activity or cold weather.  She continues an over-the-counter antihistamine once a day and has recently stopped using XHANCE due to expense.  Allergic conjunctivitis is reported as moderately well controlled with intermittent symptoms including crusty drainage occurring in the morning and occasional swelling for which he uses Pataday twice a day.  Atopic dermatitis is reported as much improved since her last visit.  She continues a daily moisturizer and rarely needs to use [place].  Her current medications are listed in the chart.   Drug Allergies:  Allergies  Allergen Reactions  . Sulfa Antibiotics Itching, Swelling and Rash    FEVER  . Tape Itching and Rash    Physical Exam: BP 92/60 (BP Location: Right Arm, Patient Position: Sitting, Cuff Size: Normal)   Pulse 61   Temp (!) 96.1 F (35.6 C) (Temporal)   Resp 15   Ht 5' 6.5" (1.689 m)   Wt 165 lb 6.4 oz (75 kg)   SpO2 96%   BMI  26.30 kg/m    Physical Exam Vitals reviewed.  Constitutional:      Appearance: Normal appearance.  HENT:     Head: Normocephalic and atraumatic.     Right Ear: Tympanic membrane normal.     Left Ear: Tympanic membrane normal.     Nose:     Comments: Bilateral nares slightly erythematous with no nasal drainage.  Pharynx normal.  Ears normal.  Eyes normal.    Mouth/Throat:     Pharynx: Oropharynx is clear.  Eyes:     Conjunctiva/sclera: Conjunctivae normal.  Cardiovascular:     Rate and Rhythm: Normal rate and regular rhythm.     Heart sounds: Normal heart sounds. No murmur.  Pulmonary:     Effort: Pulmonary effort is normal.     Breath sounds: Normal breath sounds.     Comments: Lungs clear to auscultation Musculoskeletal:        General: Normal range of motion.     Cervical back: Normal range of motion and neck supple.  Skin:    General: Skin is warm and dry.  Neurological:     Mental Status: She is alert and oriented to person, place, and time.  Psychiatric:        Mood and Affect: Mood normal.        Behavior: Behavior normal.        Thought Content: Thought content normal.        Judgment: Judgment normal.    Diagnostics: FVC 3.72, FEV1 2.44.  Predicted FVC  3.87, predicted FEV1 3.04.  Spirometry indicates mild airway obstruction.  Postbronchodilator therapy FVC 3.74, FEV1 2.57.  Postbronchodilator spirometry indicates normal ventilatory function with no significant bronchodilator response.  Assessment and Plan: 1. Restrictive airway disease   2. Seasonal and perennial allergic rhinitis   3. Allergic conjunctivitis of both eyes   4. Other atopic dermatitis     Meds ordered this encounter  Medications  . fluticasone (FLOVENT HFA) 110 MCG/ACT inhaler    Sig: For asthma flares, begin Flovent 110-2 puffs twice a day with a spacer for 2 weeks or until cough and wheeze free    Dispense:  1 Inhaler    Refill:  5    Please hold. Patient will call when needed. Thank you     Patient Instructions  Allergic rhinitis with nasal polyposis Stop Xhance  Begin Flonase 2 sprays in each nostril once a day for a stuffy nose and for the nasal polyp.  In the right nostril, point the applicator out toward the right ear. In the left nostril, point the applicator out toward the left ear Begin azelastine 2 sprays in each nostril twice a day as needed for a runny nose Continue saline nasal rinses as needed for nasal symptoms. Continue an over the counter antihistamine once a day as needed for a runny nose. Remember to rotate to a different antihistamine about every 3 months. Some examples of over the counter antihistamines include Zyrtec (cetirizine), Xyzal (levocetirizine), Allegra (fexofenadine), and Claritin (loratidine).  For thick post nasal drainage, begin Mucinex [phone] mg twice a day and increase hydration to thin mucus Continue allergen immunotherapy and have access to an epinephrine auto-injector set   Allergic conjunctivitis Continue Pazeo 1 drop in each eye once a day as needed for red, itchy eyes  Restrictive airway Continue montelukast 10 mg once a day to prevent cough or wheeze Continue albuterol 2 puffs every 4 hours as needed for cough or wheeze. For asthma flares, begin Flovent 110-2 puffs twice a day with a spacer for 2 weeks or until cough and wheeze free  Reflux Continue dietary and lifestyle modifications as listed below  Atopic dermatitis Continue a daily moisturizing routine as you have been Continue Eucrisa twice a day to red itchy areas as needed For stubborn red itchy areas below your face or neck continue betamethasone twice a day as needed  Call the clinic if this treatment plan is not working well for you  Follow up in the clinic in 6 months or sooner if needed.  Return in about 6 months (around 09/18/2020), or if symptoms worsen or fail to improve.    Thank you for the opportunity to care for this patient.  Please do not hesitate to  contact me with questions.  Gareth Morgan, FNP [facility] [place]  ________________________________________________  I have provided oversight concerning Webb Silversmith Amb's evaluation and treatment of this patient's health issues addressed during today's encounter.  I agree with the assessment and therapeutic plan as outlined in the note.   Signed,   R Edgar Frisk, MD

## 2020-03-21 ENCOUNTER — Ambulatory Visit (INDEPENDENT_AMBULATORY_CARE_PROVIDER_SITE_OTHER): Payer: 59

## 2020-03-21 DIAGNOSIS — J309 Allergic rhinitis, unspecified: Secondary | ICD-10-CM | POA: Diagnosis not present

## 2020-03-28 ENCOUNTER — Ambulatory Visit (INDEPENDENT_AMBULATORY_CARE_PROVIDER_SITE_OTHER): Payer: 59

## 2020-03-28 DIAGNOSIS — J309 Allergic rhinitis, unspecified: Secondary | ICD-10-CM

## 2020-04-03 ENCOUNTER — Ambulatory Visit (INDEPENDENT_AMBULATORY_CARE_PROVIDER_SITE_OTHER): Payer: 59

## 2020-04-03 DIAGNOSIS — J309 Allergic rhinitis, unspecified: Secondary | ICD-10-CM | POA: Diagnosis not present

## 2020-04-11 ENCOUNTER — Ambulatory Visit (INDEPENDENT_AMBULATORY_CARE_PROVIDER_SITE_OTHER): Payer: 59

## 2020-04-11 DIAGNOSIS — J309 Allergic rhinitis, unspecified: Secondary | ICD-10-CM

## 2020-04-17 ENCOUNTER — Ambulatory Visit (INDEPENDENT_AMBULATORY_CARE_PROVIDER_SITE_OTHER): Payer: 59

## 2020-04-17 DIAGNOSIS — J309 Allergic rhinitis, unspecified: Secondary | ICD-10-CM | POA: Diagnosis not present

## 2020-04-25 ENCOUNTER — Ambulatory Visit (INDEPENDENT_AMBULATORY_CARE_PROVIDER_SITE_OTHER): Payer: 59

## 2020-04-25 DIAGNOSIS — J309 Allergic rhinitis, unspecified: Secondary | ICD-10-CM | POA: Diagnosis not present

## 2020-05-02 ENCOUNTER — Ambulatory Visit (INDEPENDENT_AMBULATORY_CARE_PROVIDER_SITE_OTHER): Payer: 59 | Admitting: *Deleted

## 2020-05-02 DIAGNOSIS — J309 Allergic rhinitis, unspecified: Secondary | ICD-10-CM | POA: Diagnosis not present

## 2020-05-14 ENCOUNTER — Ambulatory Visit (INDEPENDENT_AMBULATORY_CARE_PROVIDER_SITE_OTHER): Payer: 59

## 2020-05-14 DIAGNOSIS — J309 Allergic rhinitis, unspecified: Secondary | ICD-10-CM | POA: Diagnosis not present

## 2020-05-22 ENCOUNTER — Ambulatory Visit (INDEPENDENT_AMBULATORY_CARE_PROVIDER_SITE_OTHER): Payer: 59

## 2020-05-22 DIAGNOSIS — J309 Allergic rhinitis, unspecified: Secondary | ICD-10-CM | POA: Diagnosis not present

## 2020-05-27 ENCOUNTER — Ambulatory Visit (INDEPENDENT_AMBULATORY_CARE_PROVIDER_SITE_OTHER): Payer: 59

## 2020-05-27 DIAGNOSIS — J309 Allergic rhinitis, unspecified: Secondary | ICD-10-CM

## 2020-06-13 ENCOUNTER — Ambulatory Visit (INDEPENDENT_AMBULATORY_CARE_PROVIDER_SITE_OTHER): Payer: 59

## 2020-06-13 DIAGNOSIS — J309 Allergic rhinitis, unspecified: Secondary | ICD-10-CM

## 2020-06-20 ENCOUNTER — Ambulatory Visit (INDEPENDENT_AMBULATORY_CARE_PROVIDER_SITE_OTHER): Payer: 59

## 2020-06-20 DIAGNOSIS — J309 Allergic rhinitis, unspecified: Secondary | ICD-10-CM

## 2020-06-24 ENCOUNTER — Ambulatory Visit (INDEPENDENT_AMBULATORY_CARE_PROVIDER_SITE_OTHER): Payer: 59

## 2020-06-24 DIAGNOSIS — J309 Allergic rhinitis, unspecified: Secondary | ICD-10-CM | POA: Diagnosis not present

## 2020-07-07 ENCOUNTER — Ambulatory Visit (INDEPENDENT_AMBULATORY_CARE_PROVIDER_SITE_OTHER): Payer: 59

## 2020-07-07 DIAGNOSIS — J309 Allergic rhinitis, unspecified: Secondary | ICD-10-CM

## 2020-07-14 ENCOUNTER — Ambulatory Visit: Payer: Self-pay

## 2020-07-16 ENCOUNTER — Ambulatory Visit (INDEPENDENT_AMBULATORY_CARE_PROVIDER_SITE_OTHER): Payer: 59 | Admitting: *Deleted

## 2020-07-16 DIAGNOSIS — J309 Allergic rhinitis, unspecified: Secondary | ICD-10-CM | POA: Diagnosis not present

## 2020-07-30 ENCOUNTER — Ambulatory Visit (INDEPENDENT_AMBULATORY_CARE_PROVIDER_SITE_OTHER): Payer: 59

## 2020-07-30 DIAGNOSIS — J309 Allergic rhinitis, unspecified: Secondary | ICD-10-CM

## 2020-08-06 ENCOUNTER — Ambulatory Visit (INDEPENDENT_AMBULATORY_CARE_PROVIDER_SITE_OTHER): Payer: 59 | Admitting: *Deleted

## 2020-08-06 DIAGNOSIS — J309 Allergic rhinitis, unspecified: Secondary | ICD-10-CM | POA: Diagnosis not present

## 2020-08-11 ENCOUNTER — Ambulatory Visit (INDEPENDENT_AMBULATORY_CARE_PROVIDER_SITE_OTHER): Payer: 59 | Admitting: *Deleted

## 2020-08-11 DIAGNOSIS — J309 Allergic rhinitis, unspecified: Secondary | ICD-10-CM

## 2020-08-16 ENCOUNTER — Other Ambulatory Visit: Payer: Self-pay | Admitting: Family Medicine

## 2020-08-20 ENCOUNTER — Ambulatory Visit (INDEPENDENT_AMBULATORY_CARE_PROVIDER_SITE_OTHER): Payer: 59

## 2020-08-20 DIAGNOSIS — J309 Allergic rhinitis, unspecified: Secondary | ICD-10-CM | POA: Diagnosis not present

## 2020-08-27 ENCOUNTER — Ambulatory Visit (INDEPENDENT_AMBULATORY_CARE_PROVIDER_SITE_OTHER): Payer: 59

## 2020-08-27 DIAGNOSIS — J309 Allergic rhinitis, unspecified: Secondary | ICD-10-CM | POA: Diagnosis not present

## 2020-09-02 ENCOUNTER — Other Ambulatory Visit: Payer: Self-pay | Admitting: Family Medicine

## 2020-09-03 ENCOUNTER — Ambulatory Visit (INDEPENDENT_AMBULATORY_CARE_PROVIDER_SITE_OTHER): Payer: 59

## 2020-09-03 DIAGNOSIS — J309 Allergic rhinitis, unspecified: Secondary | ICD-10-CM

## 2020-09-08 ENCOUNTER — Ambulatory Visit (INDEPENDENT_AMBULATORY_CARE_PROVIDER_SITE_OTHER): Payer: 59

## 2020-09-08 DIAGNOSIS — J309 Allergic rhinitis, unspecified: Secondary | ICD-10-CM

## 2020-09-12 ENCOUNTER — Other Ambulatory Visit: Payer: Self-pay | Admitting: Family Medicine

## 2020-09-22 ENCOUNTER — Ambulatory Visit (INDEPENDENT_AMBULATORY_CARE_PROVIDER_SITE_OTHER): Payer: 59

## 2020-09-22 DIAGNOSIS — J309 Allergic rhinitis, unspecified: Secondary | ICD-10-CM | POA: Diagnosis not present

## 2020-09-30 ENCOUNTER — Ambulatory Visit (INDEPENDENT_AMBULATORY_CARE_PROVIDER_SITE_OTHER): Payer: 59 | Admitting: *Deleted

## 2020-09-30 DIAGNOSIS — J309 Allergic rhinitis, unspecified: Secondary | ICD-10-CM

## 2020-10-06 DIAGNOSIS — J301 Allergic rhinitis due to pollen: Secondary | ICD-10-CM

## 2020-10-06 NOTE — Progress Notes (Signed)
VIALS EXP 10-06-21

## 2020-10-16 ENCOUNTER — Other Ambulatory Visit: Payer: Self-pay | Admitting: Family Medicine

## 2020-10-22 ENCOUNTER — Ambulatory Visit (INDEPENDENT_AMBULATORY_CARE_PROVIDER_SITE_OTHER): Payer: 59

## 2020-10-22 DIAGNOSIS — J309 Allergic rhinitis, unspecified: Secondary | ICD-10-CM

## 2020-10-29 ENCOUNTER — Ambulatory Visit (INDEPENDENT_AMBULATORY_CARE_PROVIDER_SITE_OTHER): Payer: 59

## 2020-10-29 DIAGNOSIS — J309 Allergic rhinitis, unspecified: Secondary | ICD-10-CM | POA: Diagnosis not present

## 2020-11-05 ENCOUNTER — Ambulatory Visit (INDEPENDENT_AMBULATORY_CARE_PROVIDER_SITE_OTHER): Payer: 59

## 2020-11-05 DIAGNOSIS — J309 Allergic rhinitis, unspecified: Secondary | ICD-10-CM | POA: Diagnosis not present

## 2020-11-11 ENCOUNTER — Ambulatory Visit (INDEPENDENT_AMBULATORY_CARE_PROVIDER_SITE_OTHER): Payer: 59

## 2020-11-11 DIAGNOSIS — J309 Allergic rhinitis, unspecified: Secondary | ICD-10-CM | POA: Diagnosis not present

## 2020-11-20 ENCOUNTER — Ambulatory Visit (INDEPENDENT_AMBULATORY_CARE_PROVIDER_SITE_OTHER): Payer: 59

## 2020-11-20 DIAGNOSIS — J309 Allergic rhinitis, unspecified: Secondary | ICD-10-CM

## 2020-11-22 ENCOUNTER — Other Ambulatory Visit: Payer: Self-pay | Admitting: Family Medicine

## 2020-11-24 NOTE — Telephone Encounter (Signed)
Called and left a message for patient to call our office back to inform her that courtesy refill had been sent in November and that she would need make an office visit appointment to receive further refills.

## 2020-11-28 ENCOUNTER — Ambulatory Visit (INDEPENDENT_AMBULATORY_CARE_PROVIDER_SITE_OTHER): Payer: 59

## 2020-11-28 DIAGNOSIS — J309 Allergic rhinitis, unspecified: Secondary | ICD-10-CM | POA: Diagnosis not present

## 2020-12-03 ENCOUNTER — Ambulatory Visit (INDEPENDENT_AMBULATORY_CARE_PROVIDER_SITE_OTHER): Payer: 59

## 2020-12-03 DIAGNOSIS — J309 Allergic rhinitis, unspecified: Secondary | ICD-10-CM

## 2020-12-10 ENCOUNTER — Ambulatory Visit (INDEPENDENT_AMBULATORY_CARE_PROVIDER_SITE_OTHER): Payer: 59

## 2020-12-10 DIAGNOSIS — J309 Allergic rhinitis, unspecified: Secondary | ICD-10-CM | POA: Diagnosis not present

## 2020-12-17 ENCOUNTER — Ambulatory Visit (INDEPENDENT_AMBULATORY_CARE_PROVIDER_SITE_OTHER): Payer: 59

## 2020-12-17 DIAGNOSIS — J309 Allergic rhinitis, unspecified: Secondary | ICD-10-CM

## 2020-12-23 ENCOUNTER — Ambulatory Visit (INDEPENDENT_AMBULATORY_CARE_PROVIDER_SITE_OTHER): Payer: 59

## 2020-12-23 DIAGNOSIS — J309 Allergic rhinitis, unspecified: Secondary | ICD-10-CM | POA: Diagnosis not present

## 2021-01-05 ENCOUNTER — Ambulatory Visit (INDEPENDENT_AMBULATORY_CARE_PROVIDER_SITE_OTHER): Payer: 59

## 2021-01-05 DIAGNOSIS — J309 Allergic rhinitis, unspecified: Secondary | ICD-10-CM

## 2021-01-14 ENCOUNTER — Ambulatory Visit (INDEPENDENT_AMBULATORY_CARE_PROVIDER_SITE_OTHER): Payer: 59

## 2021-01-14 DIAGNOSIS — J309 Allergic rhinitis, unspecified: Secondary | ICD-10-CM

## 2021-01-18 ENCOUNTER — Other Ambulatory Visit: Payer: Self-pay | Admitting: Family Medicine

## 2021-01-19 NOTE — Telephone Encounter (Signed)
Patient notified of refill denial for Flovent. She is due for an OV. Has already been informed of return visit needed back in November.

## 2021-01-23 ENCOUNTER — Ambulatory Visit (INDEPENDENT_AMBULATORY_CARE_PROVIDER_SITE_OTHER): Payer: 59

## 2021-01-23 DIAGNOSIS — J309 Allergic rhinitis, unspecified: Secondary | ICD-10-CM

## 2021-01-30 ENCOUNTER — Ambulatory Visit (INDEPENDENT_AMBULATORY_CARE_PROVIDER_SITE_OTHER): Payer: 59

## 2021-01-30 DIAGNOSIS — J309 Allergic rhinitis, unspecified: Secondary | ICD-10-CM | POA: Diagnosis not present

## 2021-02-03 ENCOUNTER — Ambulatory Visit (INDEPENDENT_AMBULATORY_CARE_PROVIDER_SITE_OTHER): Payer: 59

## 2021-02-03 DIAGNOSIS — J309 Allergic rhinitis, unspecified: Secondary | ICD-10-CM

## 2021-02-13 ENCOUNTER — Ambulatory Visit (INDEPENDENT_AMBULATORY_CARE_PROVIDER_SITE_OTHER): Payer: 59

## 2021-02-13 DIAGNOSIS — J309 Allergic rhinitis, unspecified: Secondary | ICD-10-CM

## 2021-02-17 ENCOUNTER — Ambulatory Visit: Payer: 59 | Admitting: Family Medicine

## 2021-02-24 ENCOUNTER — Ambulatory Visit (INDEPENDENT_AMBULATORY_CARE_PROVIDER_SITE_OTHER): Payer: 59

## 2021-02-24 DIAGNOSIS — J309 Allergic rhinitis, unspecified: Secondary | ICD-10-CM

## 2021-03-02 ENCOUNTER — Other Ambulatory Visit: Payer: Self-pay

## 2021-03-02 ENCOUNTER — Ambulatory Visit: Payer: Self-pay

## 2021-03-02 ENCOUNTER — Ambulatory Visit (INDEPENDENT_AMBULATORY_CARE_PROVIDER_SITE_OTHER): Payer: 59 | Admitting: Family Medicine

## 2021-03-02 ENCOUNTER — Encounter: Payer: Self-pay | Admitting: Family Medicine

## 2021-03-02 VITALS — BP 106/60 | HR 59 | Temp 97.9°F | Resp 16 | Ht 65.35 in | Wt 161.6 lb

## 2021-03-02 DIAGNOSIS — H1013 Acute atopic conjunctivitis, bilateral: Secondary | ICD-10-CM | POA: Diagnosis not present

## 2021-03-02 DIAGNOSIS — K219 Gastro-esophageal reflux disease without esophagitis: Secondary | ICD-10-CM

## 2021-03-02 DIAGNOSIS — J309 Allergic rhinitis, unspecified: Secondary | ICD-10-CM | POA: Diagnosis not present

## 2021-03-02 DIAGNOSIS — J330 Polyp of nasal cavity: Secondary | ICD-10-CM

## 2021-03-02 DIAGNOSIS — J4540 Moderate persistent asthma, uncomplicated: Secondary | ICD-10-CM | POA: Diagnosis not present

## 2021-03-02 DIAGNOSIS — J3089 Other allergic rhinitis: Secondary | ICD-10-CM

## 2021-03-02 MED ORDER — EPINEPHRINE 0.3 MG/0.3ML IJ SOAJ: 0.3000 mg | INTRAMUSCULAR | 1 refills | Status: DC | PRN

## 2021-03-02 NOTE — Patient Instructions (Addendum)
Allergic rhinitis with nasal polyposis Continue Flonase 2 sprays in each nostril once a day for a stuffy nose and for the nasal polyp.  In the right nostril, point the applicator out toward the right ear. In the left nostril, point the applicator out toward the left ear Consider saline nasal rinses as needed for nasal symptoms. Use this before any medicated nasal sprays for best result Begin azelastine 2 sprays in each nostril twice a day as needed for nasal symptoms Continue saline nasal rinses as needed for nasal symptoms. Continue an over the counter antihistamine once a day as needed for a runny nose. Remember to rotate to a different antihistamine about every 3 months. Some examples of over the counter antihistamines include Zyrtec (cetirizine), Xyzal (levocetirizine), Allegra (fexofenadine), and Claritin (loratidine).  For thick post nasal drainage, begin Mucinex [phone] mg twice a day and increase hydration to thin mucus Continue allergen immunotherapy and have access to an epinephrine auto-injector set   Allergic conjunctivitis Some over the counter eye drops include Pataday one drop in each eye once a day as needed for red, itchy eyes OR Zaditor one drop in each eye twice a day as needed for red itchy eyes.  Asthma Continue montelukast 10 mg once a day to prevent cough or wheeze Continue Flovent 110-2 puffs once a day with a spacer to prevent cough or wheeze Continue albuterol 2 puffs every 4 hours as needed for cough or wheeze.  Reflux Continue dietary and lifestyle modifications as listed below  Atopic dermatitis Continue a daily moisturizing routine as you have been Continue Eucrisa twice a day to red itchy areas as needed  Call the clinic if this treatment plan is not working well for you  Follow up in the clinic in 6 months or sooner if needed.  Lifestyle Changes for Controlling GERD When you have GERD, stomach acid feels as if it's backing up toward your mouth. Whether  or not you take medication to control your GERD, your symptoms can often be improved with lifestyle changes.   Raise Your Head  Reflux is more likely to strike when you're lying down flat, because stomach fluid can  flow backward more easily. Raising the head of your bed 4-6 inches can help. To do this:  Slide blocks or books under the legs at the head of your bed. Or, place a wedge under  the mattress. Many foam stores can make a suitable wedge for you. The wedge  should run from your waist to the top of your head.  Don't just prop your head on several pillows. This increases pressure on your  stomach. It can make GERD worse.  Watch Your Eating Habits Certain foods may increase the acid in your stomach or relax the lower esophageal sphincter, making GERD more likely. It's best to avoid the following:  Coffee, tea, and carbonated drinks (with and without caffeine)  Fatty, fried, or spicy food  Mint, chocolate, onions, and tomatoes  Any other foods that seem to irritate your stomach or cause you pain  Relieve the Pressure  Eat smaller meals, even if you have to eat more often.  Don't lie down right after you eat. Wait a few hours for your stomach to empty.  Avoid tight belts and tight-fitting clothes.  Lose excess weight.  Tobacco and Alcohol  Avoid smoking tobacco and drinking alcohol. They can make GERD symptoms worse.

## 2021-03-02 NOTE — Progress Notes (Signed)
VIALS EXP 03-02-22

## 2021-03-02 NOTE — Progress Notes (Signed)
[address] [place] [address] Dept: [phone]  FOLLOW UP NOTE  Patient ID: Patty Thompson, female    DOB: December 18, 1965  Age: 55 y.o. MRN: [id number] Date of Office Visit: 03/02/2021  Assessment  Chief Complaint: Asthma  HPI Patty Thompson is a 55 year old female who presents to the clinic for follow-up visit.  She was last seen in this clinic on 03/19/2020 for evaluation of asthma, allergic rhinitis on allergen immunotherapy, allergic conjunctivitis and atopic dermatitis.  At today's visit, she reports her asthma has been moderately well controlled with shortness of breath occasionally during vigorous activity such as playing tennis.  She denies shortness of breath, cough, wheeze with moderate activity and rest.  She continues Flovent 110-2 puffs once a day as well as infrequent use of albuterol.  Allergic rhinitis is reported as moderately well controlled with nasal congestion, thick clear to yellow and back to clear nasal drainage, and copious postnasal drainage.  She continues an over-the-counter antihistamine once a day, Flonase 2 sprays in each nostril once a day.  She is not currently using azelastine nasal spray.  She does report that [facility] caused epistaxis.  Allergic conjunctivitis is reported as moderately well controlled with red, itchy, eyes which are occasionally puffy in the morning.  She continues Pataday eyedrops once a day as needed.  Atopic dermatitis is reported as well controlled with a daily moisturizer in addition to Eucrisa to red itchy areas as needed.  Her current medications are listed in the chart.   Drug Allergies:  Allergies  Allergen Reactions  . Sulfa Antibiotics Itching, Swelling and Rash    FEVER  . Tape Itching and Rash    Physical Exam: BP 106/60 (BP Location: Left Arm, Patient Position: Sitting, Cuff Size: Normal)   Pulse (!) 59   Temp 97.9 F (36.6 C) (Temporal)   Resp 16   Ht 5' 5.35" (1.66 m)   Wt 161 lb 9.6 oz (73.3  kg)   SpO2 98%   BMI 26.60 kg/m    Physical Exam Vitals reviewed.  Constitutional:      Appearance: Normal appearance.  HENT:     Head: Normocephalic and atraumatic.     Right Ear: Tympanic membrane normal.     Left Ear: Tympanic membrane normal.     Nose:     Comments: Bilateral nares slightly erythematous with clear nasal drainage noted.  Pharynx normal.  Ears normal.  Eyes normal.    Mouth/Throat:     Pharynx: Oropharynx is clear.  Eyes:     Conjunctiva/sclera: Conjunctivae normal.  Cardiovascular:     Rate and Rhythm: Normal rate and regular rhythm.     Heart sounds: Normal heart sounds. No murmur heard.   Pulmonary:     Effort: Pulmonary effort is normal.     Breath sounds: Normal breath sounds.     Comments: Lungs clear to auscultation Musculoskeletal:        General: Normal range of motion.     Cervical back: Normal range of motion and neck supple.  Skin:    General: Skin is warm and dry.  Neurological:     Mental Status: She is alert and oriented to person, place, and time.  Psychiatric:        Mood and Affect: Mood normal.        Behavior: Behavior normal.        Thought Content: Thought content normal.        Judgment: Judgment normal.  Diagnostics: FVC 3.51, FEV1 2.53.  Predicted FVC 3.60, predicted FEV1 2.83.  Spirometry indicates normal ventilatory function.  Assessment and Plan: 1. Moderate persistent asthma without complication   2. Seasonal and perennial allergic rhinitis   3. Allergic conjunctivitis of both eyes   4. Gastroesophageal reflux disease, unspecified whether esophagitis present   5. Polyp of nasal cavity     Meds ordered this encounter  Medications  . EPINEPHrine (AUVI-Q) 0.3 mg/0.3 mL IJ SOAJ injection    Sig: Inject 0.3 mg into the muscle as needed for anaphylaxis.    Dispense:  2 each    Refill:  1    Patient Instructions  Allergic rhinitis with nasal polyposis Continue Flonase 2 sprays in each nostril once a day for a  stuffy nose and for the nasal polyp.  In the right nostril, point the applicator out toward the right ear. In the left nostril, point the applicator out toward the left ear Consider saline nasal rinses as needed for nasal symptoms. Use this before any medicated nasal sprays for best result Begin azelastine 2 sprays in each nostril twice a day as needed for nasal symptoms Continue saline nasal rinses as needed for nasal symptoms. Continue an over the counter antihistamine once a day as needed for a runny nose. Remember to rotate to a different antihistamine about every 3 months. Some examples of over the counter antihistamines include Zyrtec (cetirizine), Xyzal (levocetirizine), Allegra (fexofenadine), and Claritin (loratidine).  For thick post nasal drainage, begin Mucinex [phone] mg twice a day and increase hydration to thin mucus Continue allergen immunotherapy and have access to an epinephrine auto-injector set   Allergic conjunctivitis Some over the counter eye drops include Pataday one drop in each eye once a day as needed for red, itchy eyes OR Zaditor one drop in each eye twice a day as needed for red itchy eyes.  Asthma Continue montelukast 10 mg once a day to prevent cough or wheeze Continue Flovent 110-2 puffs once a day with a spacer to prevent cough or wheeze Continue albuterol 2 puffs every 4 hours as needed for cough or wheeze.  Reflux Continue dietary and lifestyle modifications as listed below  Atopic dermatitis Continue a daily moisturizing routine as you have been Continue Eucrisa twice a day to red itchy areas as needed  Call the clinic if this treatment plan is not working well for you  Follow up in the clinic in 6 months or sooner if needed.   Return in about 6 months (around 09/01/2021), or if symptoms worsen or fail to improve.    Thank you for the opportunity to care for this patient.  Please do not hesitate to contact me with questions.  Gareth Morgan,  FNP [facility] [place] [facility] [place]

## 2021-03-03 DIAGNOSIS — J3089 Other allergic rhinitis: Secondary | ICD-10-CM

## 2021-03-23 ENCOUNTER — Ambulatory Visit (INDEPENDENT_AMBULATORY_CARE_PROVIDER_SITE_OTHER): Payer: 59

## 2021-03-23 DIAGNOSIS — J309 Allergic rhinitis, unspecified: Secondary | ICD-10-CM | POA: Diagnosis not present

## 2021-03-30 ENCOUNTER — Ambulatory Visit (INDEPENDENT_AMBULATORY_CARE_PROVIDER_SITE_OTHER): Payer: 59

## 2021-03-30 DIAGNOSIS — J309 Allergic rhinitis, unspecified: Secondary | ICD-10-CM | POA: Diagnosis not present

## 2021-04-08 ENCOUNTER — Ambulatory Visit (INDEPENDENT_AMBULATORY_CARE_PROVIDER_SITE_OTHER): Payer: 59

## 2021-04-08 DIAGNOSIS — J309 Allergic rhinitis, unspecified: Secondary | ICD-10-CM

## 2021-04-15 ENCOUNTER — Ambulatory Visit (INDEPENDENT_AMBULATORY_CARE_PROVIDER_SITE_OTHER): Payer: 59

## 2021-04-15 DIAGNOSIS — J309 Allergic rhinitis, unspecified: Secondary | ICD-10-CM | POA: Diagnosis not present

## 2021-04-28 ENCOUNTER — Ambulatory Visit (INDEPENDENT_AMBULATORY_CARE_PROVIDER_SITE_OTHER): Payer: 59

## 2021-04-28 DIAGNOSIS — J309 Allergic rhinitis, unspecified: Secondary | ICD-10-CM | POA: Diagnosis not present

## 2021-05-13 ENCOUNTER — Ambulatory Visit (INDEPENDENT_AMBULATORY_CARE_PROVIDER_SITE_OTHER): Payer: 59

## 2021-05-13 DIAGNOSIS — J309 Allergic rhinitis, unspecified: Secondary | ICD-10-CM | POA: Diagnosis not present

## 2021-05-28 ENCOUNTER — Ambulatory Visit (INDEPENDENT_AMBULATORY_CARE_PROVIDER_SITE_OTHER): Payer: 59

## 2021-05-28 DIAGNOSIS — J309 Allergic rhinitis, unspecified: Secondary | ICD-10-CM

## 2021-06-08 ENCOUNTER — Ambulatory Visit (INDEPENDENT_AMBULATORY_CARE_PROVIDER_SITE_OTHER): Payer: 59 | Admitting: *Deleted

## 2021-06-08 DIAGNOSIS — J309 Allergic rhinitis, unspecified: Secondary | ICD-10-CM | POA: Diagnosis not present

## 2021-06-15 ENCOUNTER — Ambulatory Visit (INDEPENDENT_AMBULATORY_CARE_PROVIDER_SITE_OTHER): Payer: 59

## 2021-06-15 DIAGNOSIS — J309 Allergic rhinitis, unspecified: Secondary | ICD-10-CM

## 2021-06-23 DIAGNOSIS — J301 Allergic rhinitis due to pollen: Secondary | ICD-10-CM

## 2021-06-23 NOTE — Progress Notes (Signed)
VIALS MADE. EXP 06-23-22

## 2021-07-08 ENCOUNTER — Ambulatory Visit (INDEPENDENT_AMBULATORY_CARE_PROVIDER_SITE_OTHER): Payer: 59

## 2021-07-08 DIAGNOSIS — J309 Allergic rhinitis, unspecified: Secondary | ICD-10-CM

## 2021-07-23 ENCOUNTER — Ambulatory Visit (INDEPENDENT_AMBULATORY_CARE_PROVIDER_SITE_OTHER): Payer: 59

## 2021-07-23 DIAGNOSIS — J309 Allergic rhinitis, unspecified: Secondary | ICD-10-CM | POA: Diagnosis not present

## 2021-07-30 ENCOUNTER — Ambulatory Visit (INDEPENDENT_AMBULATORY_CARE_PROVIDER_SITE_OTHER): Payer: 59

## 2021-07-30 DIAGNOSIS — J309 Allergic rhinitis, unspecified: Secondary | ICD-10-CM | POA: Diagnosis not present

## 2021-08-05 ENCOUNTER — Ambulatory Visit (INDEPENDENT_AMBULATORY_CARE_PROVIDER_SITE_OTHER): Payer: 59

## 2021-08-05 DIAGNOSIS — J309 Allergic rhinitis, unspecified: Secondary | ICD-10-CM | POA: Diagnosis not present

## 2021-08-19 ENCOUNTER — Ambulatory Visit (INDEPENDENT_AMBULATORY_CARE_PROVIDER_SITE_OTHER): Payer: 59

## 2021-08-19 DIAGNOSIS — J309 Allergic rhinitis, unspecified: Secondary | ICD-10-CM

## 2021-08-26 ENCOUNTER — Ambulatory Visit (INDEPENDENT_AMBULATORY_CARE_PROVIDER_SITE_OTHER): Payer: 59

## 2021-08-26 DIAGNOSIS — J309 Allergic rhinitis, unspecified: Secondary | ICD-10-CM | POA: Diagnosis not present

## 2021-09-04 ENCOUNTER — Ambulatory Visit (INDEPENDENT_AMBULATORY_CARE_PROVIDER_SITE_OTHER): Payer: 59

## 2021-09-04 DIAGNOSIS — J309 Allergic rhinitis, unspecified: Secondary | ICD-10-CM | POA: Diagnosis not present

## 2021-09-09 ENCOUNTER — Ambulatory Visit (INDEPENDENT_AMBULATORY_CARE_PROVIDER_SITE_OTHER): Payer: 59

## 2021-09-09 DIAGNOSIS — J309 Allergic rhinitis, unspecified: Secondary | ICD-10-CM | POA: Diagnosis not present

## 2021-09-16 ENCOUNTER — Ambulatory Visit (INDEPENDENT_AMBULATORY_CARE_PROVIDER_SITE_OTHER): Payer: 59

## 2021-09-16 DIAGNOSIS — J309 Allergic rhinitis, unspecified: Secondary | ICD-10-CM

## 2021-09-24 ENCOUNTER — Ambulatory Visit (INDEPENDENT_AMBULATORY_CARE_PROVIDER_SITE_OTHER): Payer: 59

## 2021-09-24 DIAGNOSIS — J309 Allergic rhinitis, unspecified: Secondary | ICD-10-CM

## 2021-10-05 ENCOUNTER — Ambulatory Visit (INDEPENDENT_AMBULATORY_CARE_PROVIDER_SITE_OTHER): Payer: 59 | Admitting: *Deleted

## 2021-10-05 DIAGNOSIS — J309 Allergic rhinitis, unspecified: Secondary | ICD-10-CM

## 2021-10-21 ENCOUNTER — Ambulatory Visit (INDEPENDENT_AMBULATORY_CARE_PROVIDER_SITE_OTHER): Payer: 59

## 2021-10-21 DIAGNOSIS — J309 Allergic rhinitis, unspecified: Secondary | ICD-10-CM | POA: Diagnosis not present

## 2021-10-28 DIAGNOSIS — J3089 Other allergic rhinitis: Secondary | ICD-10-CM

## 2021-10-28 NOTE — Progress Notes (Signed)
VIALS MADE. EXP 10-28-22

## 2021-10-30 ENCOUNTER — Ambulatory Visit (INDEPENDENT_AMBULATORY_CARE_PROVIDER_SITE_OTHER): Payer: 59

## 2021-10-30 DIAGNOSIS — J309 Allergic rhinitis, unspecified: Secondary | ICD-10-CM | POA: Diagnosis not present

## 2021-11-10 ENCOUNTER — Ambulatory Visit (INDEPENDENT_AMBULATORY_CARE_PROVIDER_SITE_OTHER): Payer: 59

## 2021-11-10 DIAGNOSIS — J309 Allergic rhinitis, unspecified: Secondary | ICD-10-CM | POA: Diagnosis not present

## 2021-11-19 ENCOUNTER — Ambulatory Visit (INDEPENDENT_AMBULATORY_CARE_PROVIDER_SITE_OTHER): Payer: 59

## 2021-11-19 DIAGNOSIS — J309 Allergic rhinitis, unspecified: Secondary | ICD-10-CM

## 2021-11-23 ENCOUNTER — Ambulatory Visit: Payer: Self-pay

## 2021-11-26 ENCOUNTER — Ambulatory Visit (INDEPENDENT_AMBULATORY_CARE_PROVIDER_SITE_OTHER): Payer: 59

## 2021-11-26 DIAGNOSIS — J309 Allergic rhinitis, unspecified: Secondary | ICD-10-CM | POA: Diagnosis not present

## 2021-12-07 ENCOUNTER — Other Ambulatory Visit: Payer: Self-pay | Admitting: Family Medicine

## 2021-12-08 ENCOUNTER — Ambulatory Visit (INDEPENDENT_AMBULATORY_CARE_PROVIDER_SITE_OTHER): Payer: 59

## 2021-12-08 DIAGNOSIS — J309 Allergic rhinitis, unspecified: Secondary | ICD-10-CM | POA: Diagnosis not present

## 2021-12-18 ENCOUNTER — Other Ambulatory Visit: Payer: Self-pay | Admitting: Family

## 2021-12-18 DIAGNOSIS — Z9189 Other specified personal risk factors, not elsewhere classified: Secondary | ICD-10-CM

## 2021-12-24 ENCOUNTER — Ambulatory Visit (INDEPENDENT_AMBULATORY_CARE_PROVIDER_SITE_OTHER): Payer: 59

## 2021-12-24 DIAGNOSIS — J309 Allergic rhinitis, unspecified: Secondary | ICD-10-CM | POA: Diagnosis not present

## 2021-12-31 ENCOUNTER — Ambulatory Visit (INDEPENDENT_AMBULATORY_CARE_PROVIDER_SITE_OTHER): Payer: 59 | Admitting: *Deleted

## 2021-12-31 DIAGNOSIS — J309 Allergic rhinitis, unspecified: Secondary | ICD-10-CM

## 2022-01-14 ENCOUNTER — Ambulatory Visit (INDEPENDENT_AMBULATORY_CARE_PROVIDER_SITE_OTHER): Payer: 59

## 2022-01-14 DIAGNOSIS — J309 Allergic rhinitis, unspecified: Secondary | ICD-10-CM

## 2022-01-27 ENCOUNTER — Ambulatory Visit (INDEPENDENT_AMBULATORY_CARE_PROVIDER_SITE_OTHER): Payer: 59

## 2022-01-27 DIAGNOSIS — J309 Allergic rhinitis, unspecified: Secondary | ICD-10-CM | POA: Diagnosis not present

## 2022-02-02 DIAGNOSIS — J301 Allergic rhinitis due to pollen: Secondary | ICD-10-CM | POA: Diagnosis not present

## 2022-02-02 NOTE — Progress Notes (Signed)
VIALS EXP 02-03-23 ?

## 2022-02-09 ENCOUNTER — Ambulatory Visit (INDEPENDENT_AMBULATORY_CARE_PROVIDER_SITE_OTHER): Payer: 59

## 2022-02-09 DIAGNOSIS — J309 Allergic rhinitis, unspecified: Secondary | ICD-10-CM | POA: Diagnosis not present

## 2022-02-22 ENCOUNTER — Ambulatory Visit (INDEPENDENT_AMBULATORY_CARE_PROVIDER_SITE_OTHER): Payer: 59

## 2022-02-22 DIAGNOSIS — J309 Allergic rhinitis, unspecified: Secondary | ICD-10-CM | POA: Diagnosis not present

## 2022-03-04 ENCOUNTER — Ambulatory Visit (INDEPENDENT_AMBULATORY_CARE_PROVIDER_SITE_OTHER): Payer: 59

## 2022-03-04 DIAGNOSIS — J309 Allergic rhinitis, unspecified: Secondary | ICD-10-CM

## 2022-03-12 ENCOUNTER — Ambulatory Visit (INDEPENDENT_AMBULATORY_CARE_PROVIDER_SITE_OTHER): Payer: 59

## 2022-03-12 DIAGNOSIS — J309 Allergic rhinitis, unspecified: Secondary | ICD-10-CM | POA: Diagnosis not present

## 2022-03-19 ENCOUNTER — Ambulatory Visit (INDEPENDENT_AMBULATORY_CARE_PROVIDER_SITE_OTHER): Payer: 59

## 2022-03-19 DIAGNOSIS — J309 Allergic rhinitis, unspecified: Secondary | ICD-10-CM

## 2022-03-31 ENCOUNTER — Ambulatory Visit (INDEPENDENT_AMBULATORY_CARE_PROVIDER_SITE_OTHER): Payer: 59

## 2022-03-31 DIAGNOSIS — J309 Allergic rhinitis, unspecified: Secondary | ICD-10-CM | POA: Diagnosis not present

## 2022-04-06 ENCOUNTER — Ambulatory Visit
Admission: RE | Admit: 2022-04-06 | Discharge: 2022-04-06 | Disposition: A | Discharge status: Home / Self Care | Payer: 59 | Source: Ambulatory Visit | Attending: Family | Admitting: Family

## 2022-04-06 DIAGNOSIS — Z9189 Other specified personal risk factors, not elsewhere classified: Secondary | ICD-10-CM

## 2022-04-06 MED ORDER — GADOBUTROL 1 MMOL/ML IV SOLN
7.0000 mL | Freq: Once | INTRAVENOUS | Status: AC | PRN
  Administered 2022-04-06: 7 mL via INTRAVENOUS

## 2022-04-20 ENCOUNTER — Ambulatory Visit (INDEPENDENT_AMBULATORY_CARE_PROVIDER_SITE_OTHER): Payer: 59

## 2022-04-20 DIAGNOSIS — J309 Allergic rhinitis, unspecified: Secondary | ICD-10-CM

## 2022-04-27 ENCOUNTER — Ambulatory Visit (INDEPENDENT_AMBULATORY_CARE_PROVIDER_SITE_OTHER): Payer: 59

## 2022-04-27 DIAGNOSIS — J309 Allergic rhinitis, unspecified: Secondary | ICD-10-CM | POA: Diagnosis not present

## 2022-05-07 ENCOUNTER — Ambulatory Visit (INDEPENDENT_AMBULATORY_CARE_PROVIDER_SITE_OTHER): Payer: 59

## 2022-05-07 DIAGNOSIS — J309 Allergic rhinitis, unspecified: Secondary | ICD-10-CM | POA: Diagnosis not present

## 2022-05-18 ENCOUNTER — Ambulatory Visit (INDEPENDENT_AMBULATORY_CARE_PROVIDER_SITE_OTHER): Payer: 59

## 2022-05-18 DIAGNOSIS — J309 Allergic rhinitis, unspecified: Secondary | ICD-10-CM

## 2022-06-10 ENCOUNTER — Ambulatory Visit (INDEPENDENT_AMBULATORY_CARE_PROVIDER_SITE_OTHER): Payer: 59

## 2022-06-10 DIAGNOSIS — J309 Allergic rhinitis, unspecified: Secondary | ICD-10-CM | POA: Diagnosis not present

## 2022-06-18 NOTE — Progress Notes (Signed)
EXP 06/19/23

## 2022-06-21 ENCOUNTER — Ambulatory Visit (INDEPENDENT_AMBULATORY_CARE_PROVIDER_SITE_OTHER): Payer: 59

## 2022-06-21 DIAGNOSIS — J309 Allergic rhinitis, unspecified: Secondary | ICD-10-CM | POA: Diagnosis not present

## 2022-06-22 DIAGNOSIS — J3089 Other allergic rhinitis: Secondary | ICD-10-CM | POA: Diagnosis not present

## 2022-07-01 ENCOUNTER — Ambulatory Visit (INDEPENDENT_AMBULATORY_CARE_PROVIDER_SITE_OTHER): Payer: 59

## 2022-07-01 DIAGNOSIS — J309 Allergic rhinitis, unspecified: Secondary | ICD-10-CM | POA: Diagnosis not present

## 2022-07-15 ENCOUNTER — Ambulatory Visit (INDEPENDENT_AMBULATORY_CARE_PROVIDER_SITE_OTHER): Payer: 59

## 2022-07-15 DIAGNOSIS — J309 Allergic rhinitis, unspecified: Secondary | ICD-10-CM | POA: Diagnosis not present

## 2022-07-21 ENCOUNTER — Ambulatory Visit (INDEPENDENT_AMBULATORY_CARE_PROVIDER_SITE_OTHER): Payer: 59

## 2022-07-21 DIAGNOSIS — J309 Allergic rhinitis, unspecified: Secondary | ICD-10-CM

## 2022-08-04 ENCOUNTER — Ambulatory Visit (INDEPENDENT_AMBULATORY_CARE_PROVIDER_SITE_OTHER): Payer: 59

## 2022-08-04 DIAGNOSIS — J309 Allergic rhinitis, unspecified: Secondary | ICD-10-CM

## 2022-08-11 ENCOUNTER — Ambulatory Visit: Payer: Self-pay

## 2022-08-18 ENCOUNTER — Ambulatory Visit (INDEPENDENT_AMBULATORY_CARE_PROVIDER_SITE_OTHER): Payer: 59

## 2022-08-18 DIAGNOSIS — J309 Allergic rhinitis, unspecified: Secondary | ICD-10-CM | POA: Diagnosis not present

## 2022-08-25 ENCOUNTER — Ambulatory Visit (INDEPENDENT_AMBULATORY_CARE_PROVIDER_SITE_OTHER): Payer: 59

## 2022-08-25 DIAGNOSIS — J309 Allergic rhinitis, unspecified: Secondary | ICD-10-CM | POA: Diagnosis not present

## 2022-09-01 ENCOUNTER — Ambulatory Visit (INDEPENDENT_AMBULATORY_CARE_PROVIDER_SITE_OTHER): Payer: 59

## 2022-09-01 DIAGNOSIS — J309 Allergic rhinitis, unspecified: Secondary | ICD-10-CM | POA: Diagnosis not present

## 2022-09-08 ENCOUNTER — Ambulatory Visit (INDEPENDENT_AMBULATORY_CARE_PROVIDER_SITE_OTHER): Payer: 59

## 2022-09-08 DIAGNOSIS — J309 Allergic rhinitis, unspecified: Secondary | ICD-10-CM

## 2022-09-17 ENCOUNTER — Ambulatory Visit (INDEPENDENT_AMBULATORY_CARE_PROVIDER_SITE_OTHER): Payer: 59

## 2022-09-17 DIAGNOSIS — J309 Allergic rhinitis, unspecified: Secondary | ICD-10-CM

## 2022-09-21 ENCOUNTER — Ambulatory Visit (INDEPENDENT_AMBULATORY_CARE_PROVIDER_SITE_OTHER): Payer: 59

## 2022-09-21 DIAGNOSIS — J309 Allergic rhinitis, unspecified: Secondary | ICD-10-CM | POA: Diagnosis not present

## 2022-10-12 ENCOUNTER — Ambulatory Visit (INDEPENDENT_AMBULATORY_CARE_PROVIDER_SITE_OTHER): Payer: 59

## 2022-10-12 DIAGNOSIS — J309 Allergic rhinitis, unspecified: Secondary | ICD-10-CM | POA: Diagnosis not present

## 2022-11-03 DIAGNOSIS — J3089 Other allergic rhinitis: Secondary | ICD-10-CM | POA: Diagnosis not present

## 2022-11-03 NOTE — Progress Notes (Signed)
VIALS EXP 11-04-23

## 2022-11-05 ENCOUNTER — Ambulatory Visit (INDEPENDENT_AMBULATORY_CARE_PROVIDER_SITE_OTHER): Payer: 59

## 2022-11-05 DIAGNOSIS — J309 Allergic rhinitis, unspecified: Secondary | ICD-10-CM

## 2022-11-25 ENCOUNTER — Other Ambulatory Visit: Payer: Self-pay

## 2022-11-25 ENCOUNTER — Other Ambulatory Visit: Payer: Self-pay | Admitting: Family Medicine

## 2022-11-25 ENCOUNTER — Telehealth: Payer: Self-pay | Admitting: Family Medicine

## 2022-11-25 MED ORDER — ARNUITY ELLIPTA 100 MCG/ACT IN AEPB: INHALATION_SPRAY | RESPIRATORY_TRACT | 0 refills | Status: DC

## 2022-11-25 NOTE — Telephone Encounter (Signed)
Pt states flovent has been discontinued and she needs an alternate reffil, pt has appt schedule 1/16.

## 2022-11-25 NOTE — Telephone Encounter (Signed)
Arniuty Ellipta 100-1 puff once a day to replace flovent please

## 2022-11-25 NOTE — Telephone Encounter (Signed)
Patient informed that flovent has been discontinued her [place] insurance covers advair hfa,anoro ellipta,arnuity ellipta, atrovent hfa, breo ellipta,budesonide inhalation. Please advise.

## 2022-11-25 NOTE — Telephone Encounter (Signed)
Sent in arnuity ellipta 100 mcg 1 puff once daily to replace the flovent hfa. Left patient a voice mail that we sent in the arnuity ellipta to her pharmacy [facility]  [address] [place].

## 2022-11-26 NOTE — Telephone Encounter (Signed)
Pulmicort 90-2 puffs once a day please

## 2022-11-29 ENCOUNTER — Ambulatory Visit (INDEPENDENT_AMBULATORY_CARE_PROVIDER_SITE_OTHER): Payer: 59

## 2022-11-29 DIAGNOSIS — J309 Allergic rhinitis, unspecified: Secondary | ICD-10-CM | POA: Diagnosis not present

## 2022-12-06 NOTE — Progress Notes (Signed)
[place] [id number] Dept: [phone]  FOLLOW UP NOTE  Patient ID: Patty Thompson, female    DOB: 09-Dec-1965  Age: 57 y.o. MRN: [id number] Date of Office Visit: 12/07/2022  Assessment  Chief Complaint: Follow-up and Asthma  HPI Patty Thompson is a 57 year old female who presents to the clinic for follow-up visit.  She was last seen in this clinic on 03/02/2021 by Gareth Morgan, FNP, for evaluation of asthma, allergic rhinitis on allergen immunotherapy, allergic conjunctivitis, nasal polyposis, reflux, and atopic dermatitis.    At today's visit, she reports her asthma has been moderately well-controlled with occasional cough producing mucus that occurs mostly at night.  She reports this happens about twice a month.  Otherwise, she denies shortness of breath, cough, or wheeze.  She continues Flovent 110 2 puffs once a day and last used albuterol during the spring while playing tennis.  She reports that she has been out of Flovent 110 for the last 3 weeks and has not picked up her new inhaler from the pharmacy at this time.  She is not currently taking montelukast as she reports this aggravated her anxiety and cause sleep disturbances.    Allergic rhinitis is reported as moderately well-controlled with postnasal drainage with frequent throat clearing as the main symptom.  She continues Flonase and nasal saline rinses as needed.  She continues to take Allegra on allergy injection days. She began allergen immunotherapy directed toward grass pollen, tree pollen, weed pollen, dust mite, cat, dog, and cockroach on 02/15/2019.  She does report occasional large local reactions.  She reports a significant decrease in her symptoms of allergic rhinitis while continuing on allergen immunotherapy.    Allergic conjunctivitis is reported as well-controlled with no medical intervention at this time.    She continues Flonase for control of nasal polyposis.    Atopic dermatitis is  reported as well-controlled at this time, however, she frequently experiences red, dry, breakouts on her hands during the winter for which she continues a daily moisturizing routine and uses Eucrisa as needed with relief of symptoms.    Reflux is reported as moderately well-controlled with symptoms of heartburn occurring about 3 days a week for which she takes Prevacid as needed with relief of symptoms.  She does report recently changing eating habits to include eating earlier in the day and not on reflux provoking foods.    Her current medications are listed in the chart.   Drug Allergies:  Allergies  Allergen Reactions   Sulfa Antibiotics Itching, Swelling and Rash    FEVER   Tape Itching and Rash    Physical Exam: BP 114/76 (BP Location: Left Arm, Patient Position: Sitting, Cuff Size: Normal)   Pulse (!) 59   Temp 98.3 F (36.8 C) (Temporal)   Resp 18   Ht 4' 8.5" (1.435 m)   Wt 156 lb 6.4 oz (70.9 kg)   SpO2 98%   BMI 34.45 kg/m    Physical Exam Vitals reviewed.  Constitutional:      Appearance: Normal appearance.  HENT:     Head: Normocephalic and atraumatic.     Right Ear: Tympanic membrane normal.     Left Ear: Tympanic membrane normal.     Nose:     Comments: Bilateral nares slightly erythematous with clear nasal drainage noted.  Pharynx normal.  Ears normal.  Eyes normal.    Mouth/Throat:     Pharynx: Oropharynx is clear.  Eyes:     Conjunctiva/sclera: Conjunctivae  normal.  Cardiovascular:     Rate and Rhythm: Normal rate and regular rhythm.     Heart sounds: Normal heart sounds. No murmur heard. Pulmonary:     Effort: Pulmonary effort is normal.     Breath sounds: Normal breath sounds.     Comments: Lungs clear to auscultation Musculoskeletal:        General: Normal range of motion.     Cervical back: Normal range of motion and neck supple.  Skin:    General: Skin is warm and dry.  Neurological:     Mental Status: She is alert and oriented to person,  place, and time.  Psychiatric:        Mood and Affect: Mood normal.        Behavior: Behavior normal.        Thought Content: Thought content normal.        Judgment: Judgment normal.     Diagnostics: FVC 3.36, FEV1 2.28.  Predicted FVC 3.53, predicted FEV1 2.79.  Spirometry indicates normal ventilatory function.  Assessment and Plan: 1. Moderate persistent asthma without complication   2. Seasonal and perennial allergic rhinitis   3. Allergic conjunctivitis of both eyes   4. Gastroesophageal reflux disease, unspecified whether esophagitis present   5. History of nasal polyposis   6. Intrinsic atopic dermatitis     Meds ordered this encounter  Medications   albuterol (VENTOLIN HFA) 108 (90 Base) MCG/ACT inhaler    Sig: Inhale 2 puffs into the lungs every 4 (four) hours as needed for wheezing or shortness of breath.    Dispense:  18 g    Refill:  1   Crisaborole (EUCRISA) 2 % OINT    Sig: Apply 1 application  topically 2 (two) times daily as needed (to red itchy areas).    Dispense:  100 g    Refill:  5   EPINEPHrine (AUVI-Q) 0.3 mg/0.3 mL IJ SOAJ injection    Sig: Inject 0.3 mg into the muscle as needed for anaphylaxis.    Dispense:  2 each    Refill:  1    Patient Instructions  Asthma Begin Pulmicort 90-2 puffs once a day with a spacer to prevent cough or wheeze. This will replace Flovent 110 Continue albuterol 2 puffs every 4 hours as needed for cough or wheeze. For asthma flare, increase Pulmicort 90 to 2 puffs twice a day for 2 weeks or until cough and wheeze free, then return to original dosing  Allergic rhinitis with nasal polyposis Continue Flonase 2 sprays in each nostril once a day for a stuffy nose and for the nasal polyp.  In the right nostril, point the applicator out toward the right ear. In the left nostril, point the applicator out toward the left ear Consider saline nasal rinses as needed for nasal symptoms. Use this before any medicated nasal sprays for  best result Continue azelastine 2 sprays in each nostril twice a day as needed for nasal symptoms Continue saline nasal rinses as needed for nasal symptoms. Continue an over the counter antihistamine once a day as needed for a runny nose. Remember to rotate to a different antihistamine about every 3 months. Some examples of over the counter antihistamines include Zyrtec (cetirizine), Xyzal (levocetirizine), Allegra (fexofenadine), and Claritin (loratidine).  For thick post nasal drainage, begin Mucinex [phone] mg twice a day and increase hydration to thin mucus Continue allergen immunotherapy and have access to an epinephrine auto-injector set per protocol  Allergic conjunctivitis Some over the  counter eye drops include Pataday one drop in each eye once a day as needed for red, itchy eyes OR Zaditor one drop in each eye twice a day as needed for red itchy eyes.  Reflux Continue dietary and lifestyle modifications as listed below Continue prevacid once a day to control reflux  Atopic dermatitis Continue a daily moisturizing routine as you have been Continue Eucrisa twice a day to red itchy areas as needed  Call the clinic if this treatment plan is not working well for you  Follow up in the clinic in 6 months or sooner if needed.   Return in about 6 months (around 06/07/2023), or if symptoms worsen or fail to improve.    Thank you for the opportunity to care for this patient.  Please do not hesitate to contact me with questions.  Gareth Morgan, FNP [facility]

## 2022-12-06 NOTE — Patient Instructions (Incomplete)
Asthma Begin Pulmicort 90-2 puffs once a day with a spacer to prevent cough or wheeze. This will replace Flovent 110 Continue albuterol 2 puffs every 4 hours as needed for cough or wheeze. For asthma flare, increase Pulmicort 90 to 2 puffs twice a day for 2 weeks or until cough and wheeze free, then return to original dosing  Allergic rhinitis with nasal polyposis Continue Flonase 2 sprays in each nostril once a day for a stuffy nose and for the nasal polyp.  In the right nostril, point the applicator out toward the right ear. In the left nostril, point the applicator out toward the left ear Consider saline nasal rinses as needed for nasal symptoms. Use this before any medicated nasal sprays for best result Continue azelastine 2 sprays in each nostril twice a day as needed for nasal symptoms Continue saline nasal rinses as needed for nasal symptoms. Continue an over the counter antihistamine once a day as needed for a runny nose. Remember to rotate to a different antihistamine about every 3 months. Some examples of over the counter antihistamines include Zyrtec (cetirizine), Xyzal (levocetirizine), Allegra (fexofenadine), and Claritin (loratidine).  For thick post nasal drainage, begin Mucinex [phone] mg twice a day and increase hydration to thin mucus Continue allergen immunotherapy and have access to an epinephrine auto-injector set per protocol  Allergic conjunctivitis Some over the counter eye drops include Pataday one drop in each eye once a day as needed for red, itchy eyes OR Zaditor one drop in each eye twice a day as needed for red itchy eyes.  Reflux Continue dietary and lifestyle modifications as listed below Continue prevacid once a day to control reflux  Atopic dermatitis Continue a daily moisturizing routine as you have been Continue Eucrisa twice a day to red itchy areas as needed  Call the clinic if this treatment plan is not working well for you  Follow up in the clinic  in 6 months or sooner if needed.  Lifestyle Changes for Controlling GERD When you have GERD, stomach acid feels as if it's backing up toward your mouth. Whether or not you take medication to control your GERD, your symptoms can often be improved with lifestyle changes.   Raise Your Head Reflux is more likely to strike when you're lying down flat, because stomach fluid can flow backward more easily. Raising the head of your bed 4-6 inches can help. To do this: Slide blocks or books under the legs at the head of your bed. Or, place a wedge under the mattress. Many foam stores can make a suitable wedge for you. The wedge should run from your waist to the top of your head. Don't just prop your head on several pillows. This increases pressure on your stomach. It can make GERD worse.  Watch Your Eating Habits Certain foods may increase the acid in your stomach or relax the lower esophageal sphincter, making GERD more likely. It's best to avoid the following: Coffee, tea, and carbonated drinks (with and without caffeine) Fatty, fried, or spicy food Mint, chocolate, onions, and tomatoes Any other foods that seem to irritate your stomach or cause you pain  Relieve the Pressure Eat smaller meals, even if you have to eat more often. Don't lie down right after you eat. Wait a few hours for your stomach to empty. Avoid tight belts and tight-fitting clothes. Lose excess weight.  Tobacco and Alcohol Avoid smoking tobacco and drinking alcohol. They can make GERD symptoms worse.

## 2022-12-07 ENCOUNTER — Encounter: Payer: Self-pay | Admitting: Family Medicine

## 2022-12-07 ENCOUNTER — Other Ambulatory Visit: Payer: Self-pay

## 2022-12-07 ENCOUNTER — Ambulatory Visit (INDEPENDENT_AMBULATORY_CARE_PROVIDER_SITE_OTHER): Payer: 59 | Admitting: Family Medicine

## 2022-12-07 VITALS — BP 114/76 | HR 59 | Temp 98.3°F | Resp 18 | Ht <= 58 in | Wt 156.4 lb

## 2022-12-07 DIAGNOSIS — K219 Gastro-esophageal reflux disease without esophagitis: Secondary | ICD-10-CM

## 2022-12-07 DIAGNOSIS — H1013 Acute atopic conjunctivitis, bilateral: Secondary | ICD-10-CM

## 2022-12-07 DIAGNOSIS — J4540 Moderate persistent asthma, uncomplicated: Secondary | ICD-10-CM | POA: Diagnosis not present

## 2022-12-07 DIAGNOSIS — J3089 Other allergic rhinitis: Secondary | ICD-10-CM

## 2022-12-07 DIAGNOSIS — Z8709 Personal history of other diseases of the respiratory system: Secondary | ICD-10-CM | POA: Insufficient documentation

## 2022-12-07 DIAGNOSIS — L2084 Intrinsic (allergic) eczema: Secondary | ICD-10-CM

## 2022-12-07 MED ORDER — ALBUTEROL SULFATE HFA 108 (90 BASE) MCG/ACT IN AERS: 2.0000 | INHALATION_SPRAY | RESPIRATORY_TRACT | 1 refills | Status: AC | PRN

## 2022-12-07 MED ORDER — EPINEPHRINE 0.3 MG/0.3ML IJ SOAJ: 0.3000 mg | INTRAMUSCULAR | 1 refills | Status: DC | PRN

## 2022-12-07 MED ORDER — EUCRISA 2 % EX OINT: 1.0000 "application " | TOPICAL_OINTMENT | Freq: Two times a day (BID) | CUTANEOUS | 5 refills | Status: AC | PRN

## 2022-12-23 ENCOUNTER — Ambulatory Visit (INDEPENDENT_AMBULATORY_CARE_PROVIDER_SITE_OTHER): Payer: 59

## 2022-12-23 DIAGNOSIS — J309 Allergic rhinitis, unspecified: Secondary | ICD-10-CM | POA: Diagnosis not present

## 2023-01-21 ENCOUNTER — Ambulatory Visit (INDEPENDENT_AMBULATORY_CARE_PROVIDER_SITE_OTHER): Payer: 59

## 2023-01-21 DIAGNOSIS — J309 Allergic rhinitis, unspecified: Secondary | ICD-10-CM | POA: Diagnosis not present

## 2023-01-28 ENCOUNTER — Ambulatory Visit (INDEPENDENT_AMBULATORY_CARE_PROVIDER_SITE_OTHER): Payer: 59

## 2023-01-28 DIAGNOSIS — J309 Allergic rhinitis, unspecified: Secondary | ICD-10-CM | POA: Diagnosis not present

## 2023-02-08 ENCOUNTER — Ambulatory Visit (INDEPENDENT_AMBULATORY_CARE_PROVIDER_SITE_OTHER): Payer: 59

## 2023-02-08 DIAGNOSIS — J309 Allergic rhinitis, unspecified: Secondary | ICD-10-CM | POA: Diagnosis not present

## 2023-02-15 ENCOUNTER — Ambulatory Visit (INDEPENDENT_AMBULATORY_CARE_PROVIDER_SITE_OTHER): Payer: 59

## 2023-02-15 DIAGNOSIS — J309 Allergic rhinitis, unspecified: Secondary | ICD-10-CM

## 2023-02-24 ENCOUNTER — Ambulatory Visit (INDEPENDENT_AMBULATORY_CARE_PROVIDER_SITE_OTHER): Payer: 59

## 2023-02-24 DIAGNOSIS — J309 Allergic rhinitis, unspecified: Secondary | ICD-10-CM

## 2023-03-01 ENCOUNTER — Ambulatory Visit (INDEPENDENT_AMBULATORY_CARE_PROVIDER_SITE_OTHER): Payer: 59

## 2023-03-01 DIAGNOSIS — J309 Allergic rhinitis, unspecified: Secondary | ICD-10-CM

## 2023-03-17 ENCOUNTER — Ambulatory Visit (INDEPENDENT_AMBULATORY_CARE_PROVIDER_SITE_OTHER): Payer: 59

## 2023-03-17 DIAGNOSIS — J309 Allergic rhinitis, unspecified: Secondary | ICD-10-CM | POA: Diagnosis not present

## 2023-03-29 ENCOUNTER — Ambulatory Visit (INDEPENDENT_AMBULATORY_CARE_PROVIDER_SITE_OTHER): Payer: 59

## 2023-03-29 DIAGNOSIS — J309 Allergic rhinitis, unspecified: Secondary | ICD-10-CM

## 2023-03-31 ENCOUNTER — Other Ambulatory Visit: Payer: Self-pay | Admitting: Family

## 2023-03-31 DIAGNOSIS — Z9189 Other specified personal risk factors, not elsewhere classified: Secondary | ICD-10-CM

## 2023-04-11 ENCOUNTER — Ambulatory Visit (INDEPENDENT_AMBULATORY_CARE_PROVIDER_SITE_OTHER): Payer: 59

## 2023-04-11 DIAGNOSIS — J309 Allergic rhinitis, unspecified: Secondary | ICD-10-CM

## 2023-04-25 NOTE — Progress Notes (Signed)
VIALS EXP 04-24-24 

## 2023-04-27 DIAGNOSIS — J3081 Allergic rhinitis due to animal (cat) (dog) hair and dander: Secondary | ICD-10-CM | POA: Diagnosis not present

## 2023-05-17 ENCOUNTER — Ambulatory Visit (INDEPENDENT_AMBULATORY_CARE_PROVIDER_SITE_OTHER): Payer: 59

## 2023-05-17 DIAGNOSIS — J309 Allergic rhinitis, unspecified: Secondary | ICD-10-CM

## 2023-06-07 ENCOUNTER — Encounter: Payer: Self-pay | Admitting: Family Medicine

## 2023-06-07 ENCOUNTER — Ambulatory Visit (INDEPENDENT_AMBULATORY_CARE_PROVIDER_SITE_OTHER): Payer: 59 | Admitting: Family Medicine

## 2023-06-07 ENCOUNTER — Other Ambulatory Visit: Payer: Self-pay

## 2023-06-07 VITALS — BP 118/68 | HR 74 | Temp 98.1°F | Resp 18 | Wt 157.0 lb

## 2023-06-07 DIAGNOSIS — L2084 Intrinsic (allergic) eczema: Secondary | ICD-10-CM

## 2023-06-07 DIAGNOSIS — J3089 Other allergic rhinitis: Secondary | ICD-10-CM | POA: Diagnosis not present

## 2023-06-07 DIAGNOSIS — J4540 Moderate persistent asthma, uncomplicated: Secondary | ICD-10-CM | POA: Diagnosis not present

## 2023-06-07 DIAGNOSIS — K219 Gastro-esophageal reflux disease without esophagitis: Secondary | ICD-10-CM

## 2023-06-07 DIAGNOSIS — H1013 Acute atopic conjunctivitis, bilateral: Secondary | ICD-10-CM

## 2023-06-07 DIAGNOSIS — Z8709 Personal history of other diseases of the respiratory system: Secondary | ICD-10-CM

## 2023-06-07 MED ORDER — PULMICORT FLEXHALER 90 MCG/ACT IN AEPB: INHALATION_SPRAY | RESPIRATORY_TRACT | 5 refills | Status: AC

## 2023-06-07 NOTE — Patient Instructions (Addendum)
Asthma Continue albuterol 2 puffs every 4 hours as needed for cough or wheeze. For asthma flare, begin Pulmicort 90 to 1-2 puffs twice a day for 2 weeks or until cough and wheeze free, then return to original dosing  Allergic rhinitis with nasal polyposis Begin Ryaltris 2 sprays in each nostril twice a day for a stuffy nose and for nasal polyp control.  In the right nostril, point the applicator out toward the right ear. In the left nostril, point the applicator out toward the left ear Consider saline nasal rinses as needed for nasal symptoms. Use this  Continue an over the counter antihistamine once a day as needed for a runny nose. Remember to rotate to a different antihistamine about every 3 months. Some examples of over the counter antihistamines include Zyrtec (cetirizine), Xyzal (levocetirizine), Allegra (fexofenadine), and Claritin (loratidine).  For thick post nasal drainage, begin Mucinex [phone] mg twice a day and increase hydration to thin mucus Continue allergen immunotherapy and have access to an epinephrine auto-injector set per protocol. Take Allegra 180 mg   Allergic conjunctivitis Some over the counter eye drops include Pataday one drop in each eye once a day as needed for red, itchy eyes OR Zaditor one drop in each eye twice a day as needed for red itchy eyes.  Reflux Continue dietary and lifestyle modifications as listed below  Atopic dermatitis Continue a daily moisturizing routine as you have been Continue Eucrisa twice a day to red itchy areas as needed Continue hydrocortisone to red and itchy areas twice a day as needed  Call the clinic if this treatment plan is not working well for you  Follow up in the clinic in 6 months or sooner if needed.  Lifestyle Changes for Controlling GERD When you have GERD, stomach acid feels as if it's backing up toward your mouth. Whether or not you take medication to control your GERD, your symptoms can often be improved with  lifestyle changes.   Raise Your Head Reflux is more likely to strike when you're lying down flat, because stomach fluid can flow backward more easily. Raising the head of your bed 4-6 inches can help. To do this: Slide blocks or books under the legs at the head of your bed. Or, place a wedge under the mattress. Many foam stores can make a suitable wedge for you. The wedge should run from your waist to the top of your head. Don't just prop your head on several pillows. This increases pressure on your stomach. It can make GERD worse.  Watch Your Eating Habits Certain foods may increase the acid in your stomach or relax the lower esophageal sphincter, making GERD more likely. It's best to avoid the following: Coffee, tea, and carbonated drinks (with and without caffeine) Fatty, fried, or spicy food Mint, chocolate, onions, and tomatoes Any other foods that seem to irritate your stomach or cause you pain  Relieve the Pressure Eat smaller meals, even if you have to eat more often. Don't lie down right after you eat. Wait a few hours for your stomach to empty. Avoid tight belts and tight-fitting clothes. Lose excess weight.  Tobacco and Alcohol Avoid smoking tobacco and drinking alcohol. They can make GERD symptoms worse.

## 2023-06-07 NOTE — Progress Notes (Signed)
   [address] [place] [address] Dept: [phone]  FOLLOW UP NOTE  Patient ID: Patty Thompson, female    DOB: 04-01-1966  Age: 58 y.o. MRN: [id number] Date of Office Visit: 06/07/2023  Assessment  Chief Complaint: No chief complaint on file.  HPI Patty Thompson is a 57 year old female who presents to the clinic for follow-up visit.  She was last seen in this clinic on 12/07/2022 by Thermon Leyland, FNP, for evaluation of asthma, allergic rhinitis, allergic conjunctivitis, reflux, and atopic dermatitis.  She began allergen immunotherapy directed toward grass pollen, tree pollen, weed pollen, dust mite, cat, dog, and cockroach on 02/15/2019.   Drug Allergies:  Allergies  Allergen Reactions   Sulfa Antibiotics Itching, Swelling and Rash    FEVER   Tape Itching and Rash    Physical Exam: There were no vitals taken for this visit.   Physical Exam  Diagnostics: FVC 3.10, FEV1 2.17.  Predicted FVC 3.52, predicted FEV1 2.78.  Spirometry indicates normal ventilatory function.  Assessment and Plan: No diagnosis found.  No orders of the defined types were placed in this encounter.   There are no Patient Instructions on file for this visit.  No follow-ups on file.    Thank you for the opportunity to care for this patient.  Please do not hesitate to contact me with questions.  Thermon Leyland, FNP [facility]

## 2023-06-08 ENCOUNTER — Encounter: Payer: Self-pay | Admitting: Family Medicine

## 2023-06-20 ENCOUNTER — Ambulatory Visit (INDEPENDENT_AMBULATORY_CARE_PROVIDER_SITE_OTHER): Payer: 59

## 2023-06-20 DIAGNOSIS — J309 Allergic rhinitis, unspecified: Secondary | ICD-10-CM | POA: Diagnosis not present

## 2023-07-18 ENCOUNTER — Ambulatory Visit (INDEPENDENT_AMBULATORY_CARE_PROVIDER_SITE_OTHER): Payer: 59 | Admitting: Internal Medicine

## 2023-07-18 ENCOUNTER — Encounter: Payer: Self-pay | Admitting: Internal Medicine

## 2023-07-18 VITALS — BP 108/74 | HR 57 | Temp 97.9°F | Resp 17 | Wt 159.0 lb

## 2023-07-18 DIAGNOSIS — L237 Allergic contact dermatitis due to plants, except food: Secondary | ICD-10-CM

## 2023-07-18 DIAGNOSIS — L2084 Intrinsic (allergic) eczema: Secondary | ICD-10-CM

## 2023-07-18 DIAGNOSIS — J3089 Other allergic rhinitis: Secondary | ICD-10-CM

## 2023-07-18 DIAGNOSIS — H1013 Acute atopic conjunctivitis, bilateral: Secondary | ICD-10-CM | POA: Diagnosis not present

## 2023-07-18 DIAGNOSIS — Z8709 Personal history of other diseases of the respiratory system: Secondary | ICD-10-CM

## 2023-07-18 DIAGNOSIS — J4530 Mild persistent asthma, uncomplicated: Secondary | ICD-10-CM | POA: Diagnosis not present

## 2023-07-18 DIAGNOSIS — K219 Gastro-esophageal reflux disease without esophagitis: Secondary | ICD-10-CM

## 2023-07-18 MED ORDER — PREDNISONE 10 MG PO TABS: ORAL_TABLET | ORAL | 0 refills | Status: AC

## 2023-07-18 NOTE — Progress Notes (Unsigned)
Follow Up Note  RE: Patty Thompson MRN: [id number] DOB: Feb 18, 1966 Date of Office Visit: 07/18/2023  Referring provider: Raynelle Jan., MD Primary care provider: Raynelle Jan., MD  Chief Complaint: No chief complaint on file.  History of Present Illness: I had the pleasure of seeing Patty Thompson for a follow up visit at the [facility] on 07/18/2023. She is a 57 y.o. female, who is being followed for asthma, rhinitis on AIT, nasal polyps, gerd, atopic dermatitis  . Her previous allergy office visit was on 12/17/22 with Thermon Leyland, FNP. Today is a  acute visit for poison ivy  .  History obtained from patient, chart review.  10 days ago she was working in her garden and was exposed to poison ivy.  She has since had a blistering rash on legs, back  and face. Using topical steorids without good control.  She has a history of urushiol dermatitis.    Asthma is well controlled. Denies any cough, wheeze, dyspnea.  No albuterol use. No OCS/ABX since last visit.   She has transitioned from ryaltris to nasacort 2 sprays in each nostril twice daily.  Continues allegra 180mg  1-2 times per daily  Rare use of pataday eye drops.  Rhinitis symptoms are well controlled on medicines above and allergy injections.  She is getting LLR and interested in epi rinses for injections.    Reflux controlled with diet   Mild eczema flares on hands which she attributed to not using eucrisa regularly. She does have this at home and can restart.   Assessment and Plan: Patty Thompson is a 57 y.o. 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    Asthma: controlled  Continue albuterol 2 puffs every 4 hours as needed for cough or wheeze. For asthma flare, begin Pulmicort 90 to 1-2 puffs twice a day for 2 weeks or until cough and wheeze free, then return to original dosing  Allergic rhinitis with nasal polyposis: controlled  Continue Ryaltris 2 sprays in each nostril twice a day for a stuffy nose and for nasal polyp control.   Consider saline nasal rinses as needed for nasal symptoms. Use this  Continue Allegra 180mg  1-2 times per daily   For thick post nasal drainage, begin Mucinex [phone] mg twice a day and increase hydration to thin mucus Continue allergen immunotherapy and have access to an epinephrine auto-injector set per protocol. Take Allegra 180 mg   -  Will add epirinses to AIT injections   Allergic conjunctivitis Some over the counter eye drops include Pataday one drop in each eye once a day as needed for red, itchy eyes OR Zaditor one drop in each eye twice a day as needed for red itchy eyes.  Reflux: controlled  Continue dietary and lifestyle modifications    Atopic dermatitis Continue a daily moisturizing routine as you have been Restart Eucrisa twice a day to red itchy areas as needed Continue hydrocortisone to red and itchy  areas twice a day as needed  Call the clinic if this treatment plan is not working well for you  Follow up: 6 months   Thank you so much for letting me partake in your care today.  Don't hesitate to reach out if you have any additional concerns!  Patty Luz, MD  [facility] [place]     Meds ordered this encounter  Medications   predniSONE (DELTASONE) 10 MG tablet    Sig: Prednisone 10mg  : Take 2 tablets twice a day  3 more days, Then take 2 tablets once a  day for 1 day., then take 1 tablet once a day for 1 day.    Dispense:  15 tablet    Refill:  0    Lab Orders  No laboratory test(s) ordered today   Diagnostics: None done   Medication List:  Current Outpatient Medications  Medication Sig Dispense Refill   predniSONE (DELTASONE) 10 MG tablet Prednisone 10mg  : Take 2 tablets twice a day  3 more days, Then take 2 tablets once a day for 1 day., then take 1 tablet once a day for 1 day. 15 tablet 0   acetaminophen (TYLENOL) 500 MG tablet Take 1,000 mg by mouth every 6 (six) hours as needed for headache.     albuterol (VENTOLIN HFA) 108 (90 Base) MCG/ACT inhaler Inhale 2 puffs into the lungs every 4 (four) hours as needed for wheezing or shortness of breath. 18 g 1   Ascorbic Acid (VITAMIN C) 1000 MG tablet Take 1,000 mg by mouth daily.     BIOTIN PO Take 1 tablet by mouth daily.     Black Pepper-Turmeric 3-500 MG CAPS Take by mouth.     Budesonide (PULMICORT FLEXHALER) 90 MCG/ACT inhaler 2 puffs once a day 1 each 5   calcium carbonate (OS-CAL) 1250 (500 Ca) MG chewable tablet Chew by mouth.     calcium carbonate (TUMS - DOSED IN MG ELEMENTAL CALCIUM) 500 MG chewable tablet Chew 2-3 tablets by mouth daily as needed for indigestion or heartburn.     Cholecalciferol (VITAMIN D3) 2000 units capsule Take 6,000 Units by mouth daily.     Crisaborole (EUCRISA) 2 % OINT Apply 1 application  topically 2 (two) times daily as needed (to red itchy areas). 100 g 5   Epinastine HCl 0.05 % ophthalmic solution Apply to eye.     EPINEPHrine (AUVI-Q) 0.3 mg/0.3 mL IJ SOAJ injection Inject 0.3 mg into the muscle as needed for anaphylaxis. 2 each 1   fluticasone (FLONASE) 50 MCG/ACT nasal spray PLACE 2 SPRAYS INTO BOTH NOSTRILS DAILY AS NEEDED FOR ALLERGIES OR RHINITIS (FOR STUFFY NOSE AND FOR THE NASAL POLYP). 16 mL 0   ibuprofen (ADVIL,MOTRIN) 200 MG tablet Take 200 mg by mouth every 6 (six) hours as needed for moderate pain.     levocetirizine (XYZAL) 5 MG  tablet Take 1 tablet (5 mg total) by mouth every evening. 30 tablet 5   levothyroxine (SYNTHROID, LEVOTHROID) 137 MCG tablet Take 137 mcg by mouth daily before breakfast.     Multiple Vitamins-Minerals (CENTRUM SILVER 50+WOMEN) TABS Take 1 tablet by mouth daily.     NON FORMULARY      Olopatadine HCl (PAZEO) 0.7 % SOLN Place 1 drop into both eyes as needed. 1 Bottle 5   omeprazole (PRILOSEC) 20 MG capsule Take by mouth.     PARoxetine (PAXIL) 20 MG tablet Take 5 mg by mouth at bedtime.  Turmeric 500 MG CAPS Take 500 mg by mouth daily.     No current facility-administered medications for this visit.   Allergies: Allergies  Allergen Reactions   Sulfa Antibiotics Itching, Swelling and Rash    FEVER   Tape Itching and Rash   I reviewed her past medical history, social history, family history, and environmental history and no significant changes have been reported from her previous visit.  ROS: All others negative except as noted per HPI.   Objective: There were no vitals taken for this visit. There is no height or weight on file to calculate BMI. General Appearance:  Alert, cooperative, no distress, appears stated age  Head:  Normocephalic, without obvious abnormality, atraumatic  Eyes:  Conjunctiva clear, EOM's intact  Nose: Nares normal, normal mucosa, no visible anterior polyps, and septum midline  Throat: Lips, tongue normal; teeth and gums normal, normal posterior oropharynx  Neck: Supple, symmetrical  Lungs:   clear to auscultation bilaterally, Respirations unlabored, no coughing  Heart:  regular rate and rhythm and no murmur, Appears well perfused  Extremities: No edema  Skin: Skin color, texture, turgor normal, "erythematous patches with blistering on neck, face, mild eczematous patch between fingers an on hands   Neurologic: No gross deficits   Previous notes and tests were reviewed. The plan was reviewed with the patient/family, and all questions/concerned were  addressed.  It was my pleasure to see Patty Thompson today and participate in her care. Please feel free to contact me with any questions or concerns.  Sincerely,  Patty Luz, MD  Allergy & Immunology  [facility] Office: [phone]

## 2023-07-18 NOTE — Patient Instructions (Addendum)
Contact Dermatitis: Urushiol  - continue hydrocortisone 2.5 % cream twice daily  - start Prednisone 10mg  : Take 2 tablets twice a day for 3 days, Then take 2 tablets once a day for 1 day., then take 1 tablet once a day for 1 day.    Asthma: controlled  Continue albuterol 2 puffs every 4 hours as needed for cough or wheeze. For asthma flare, begin Pulmicort 90 to 1-2 puffs twice a day for 2 weeks or until cough and wheeze free, then return to original dosing  Allergic rhinitis with nasal polyposis: controlled  Continue Ryaltris 2 sprays in each nostril twice a day for a stuffy nose and for nasal polyp control.   Consider saline nasal rinses as needed for nasal symptoms. Use this  Continue Allegra 180mg  1-2 times per daily   For thick post nasal drainage, begin Mucinex [phone] mg twice a day and increase hydration to thin mucus Continue allergen immunotherapy and have access to an epinephrine auto-injector set per protocol. Take Allegra 180 mg   -  Will add epirinses to AIT injections   Allergic conjunctivitis Some over the counter eye drops include Pataday one drop in each eye once a day as needed for red, itchy eyes OR Zaditor one drop in each eye twice a day as needed for red itchy eyes.  Reflux: controlled  Continue dietary and lifestyle modifications    Atopic dermatitis Continue a daily moisturizing routine as you have been Restart Eucrisa twice a day to red itchy areas as needed Continue hydrocortisone to red and itchy areas twice a day as needed  Call the clinic if this treatment plan is not working well for you  Follow up: 6 months   Thank you so much for letting me partake in your care today.  Don't hesitate to reach out if you have any additional concerns!  Ferol Luz, MD  [facility] , [place]

## 2023-07-20 ENCOUNTER — Ambulatory Visit: Payer: Self-pay

## 2023-07-20 DIAGNOSIS — J309 Allergic rhinitis, unspecified: Secondary | ICD-10-CM | POA: Diagnosis not present

## 2023-08-25 ENCOUNTER — Ambulatory Visit (INDEPENDENT_AMBULATORY_CARE_PROVIDER_SITE_OTHER): Payer: 59

## 2023-08-25 DIAGNOSIS — J309 Allergic rhinitis, unspecified: Secondary | ICD-10-CM | POA: Diagnosis not present

## 2023-10-11 ENCOUNTER — Ambulatory Visit (INDEPENDENT_AMBULATORY_CARE_PROVIDER_SITE_OTHER): Payer: Self-pay

## 2023-10-11 DIAGNOSIS — J309 Allergic rhinitis, unspecified: Secondary | ICD-10-CM | POA: Diagnosis not present

## 2023-12-28 ENCOUNTER — Ambulatory Visit (INDEPENDENT_AMBULATORY_CARE_PROVIDER_SITE_OTHER): Payer: 59

## 2023-12-28 DIAGNOSIS — J309 Allergic rhinitis, unspecified: Secondary | ICD-10-CM

## 2024-01-06 ENCOUNTER — Ambulatory Visit (INDEPENDENT_AMBULATORY_CARE_PROVIDER_SITE_OTHER): Payer: 59 | Admitting: *Deleted

## 2024-01-06 DIAGNOSIS — J309 Allergic rhinitis, unspecified: Secondary | ICD-10-CM

## 2024-01-11 ENCOUNTER — Telehealth: Payer: Self-pay | Admitting: Internal Medicine

## 2024-01-11 MED ORDER — AUVI-Q 0.3 MG/0.3ML IJ SOAJ: 0.3000 mg | INTRAMUSCULAR | 1 refills | Status: DC | PRN

## 2024-01-11 NOTE — Telephone Encounter (Signed)
 Sent in epi refill to [facility] [address] as pt is on injections and requires this to be on injections

## 2024-01-11 NOTE — Telephone Encounter (Signed)
 Patient asking for an epi-Pen refill to bring to allergy injections please send to [facility] on [facility] [facility] [place]

## 2024-01-17 ENCOUNTER — Ambulatory Visit (INDEPENDENT_AMBULATORY_CARE_PROVIDER_SITE_OTHER): Payer: 59

## 2024-01-17 ENCOUNTER — Other Ambulatory Visit: Payer: Self-pay

## 2024-01-17 DIAGNOSIS — J309 Allergic rhinitis, unspecified: Secondary | ICD-10-CM | POA: Diagnosis not present

## 2024-01-17 MED ORDER — AUVI-Q 0.3 MG/0.3ML IJ SOAJ: 0.3000 mg | INTRAMUSCULAR | 1 refills | Status: AC | PRN

## 2024-01-17 NOTE — Telephone Encounter (Signed)
 Refill for Auvi-Q 0.3 mg/mL sent to [facility] x 2 with 1 refill. Left voicemail message to let her know it will be coming through the mail and not local  pharmacy.

## 2024-01-25 ENCOUNTER — Ambulatory Visit (INDEPENDENT_AMBULATORY_CARE_PROVIDER_SITE_OTHER): Payer: Self-pay

## 2024-01-25 DIAGNOSIS — J309 Allergic rhinitis, unspecified: Secondary | ICD-10-CM

## 2024-03-06 ENCOUNTER — Ambulatory Visit (INDEPENDENT_AMBULATORY_CARE_PROVIDER_SITE_OTHER): Payer: Self-pay

## 2024-03-06 DIAGNOSIS — J309 Allergic rhinitis, unspecified: Secondary | ICD-10-CM

## 2024-03-20 ENCOUNTER — Ambulatory Visit (INDEPENDENT_AMBULATORY_CARE_PROVIDER_SITE_OTHER): Payer: Self-pay

## 2024-03-20 DIAGNOSIS — J309 Allergic rhinitis, unspecified: Secondary | ICD-10-CM

## 2024-04-02 ENCOUNTER — Ambulatory Visit (INDEPENDENT_AMBULATORY_CARE_PROVIDER_SITE_OTHER): Payer: Self-pay

## 2024-04-02 DIAGNOSIS — J309 Allergic rhinitis, unspecified: Secondary | ICD-10-CM

## 2024-04-25 IMAGING — MR MR BREAST BILAT WO/W CM
8 of 12 series · 33 of 48 positions shown · IV contrast (10 ml gadavist)
Comparison: Prior exams including previous breast MRI dated
09/11/2018.

CLINICAL DATA: Previous right breast excision revealing atypical
lobular hyperplasia as well as complex sclerosing lesion. Patient is
being followed for probably benign right breast calcifications
adjacent to the right breast surgical site.

Patient reports a hard right breast lump for 1 and half years.
Cousin with a history of breast carcinoma at age 27.
EXAM:
BILATERAL BREAST MRI WITH AND WITHOUT CONTRAST
TECHNIQUE: Multiplanar, multisequence MR images of both breasts were obtained
prior to and following the intravenous administration of 7 ml of
Gadavist

[Series 2: t2_tirm_tra ipat (a-p) · axial · 3.0mm · 0.70mm/px · 1 of 55 slices shown]
[im 1/55]
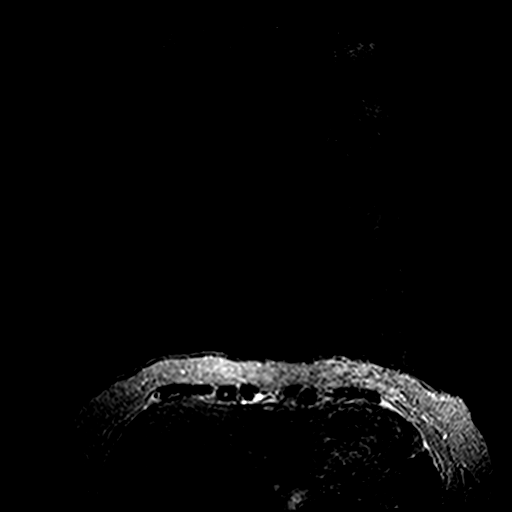

[Series 3: fl3d pre-cm no · axial · non-contrast · 1.2mm · 0.78mm/px · z∈[-86,+86]mm · 5 of 144 slices shown]
[im 1/144]
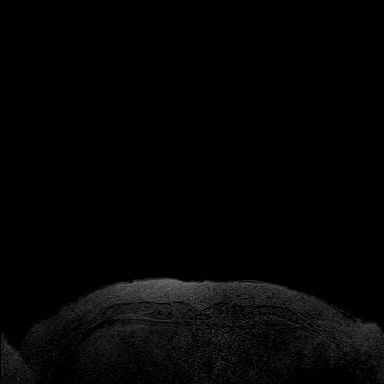
[im 36/144]
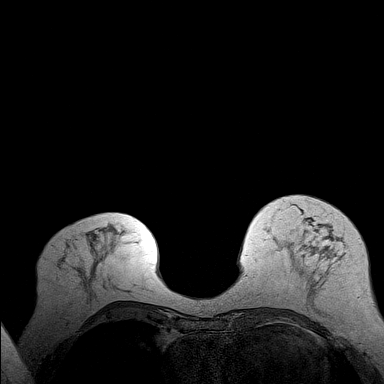
[im 72/144]
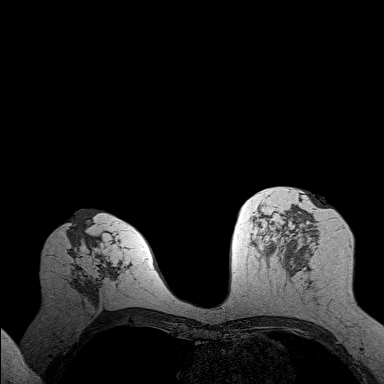
[im 108/144]
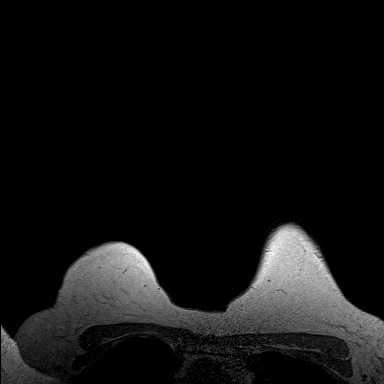
[im 144/144]
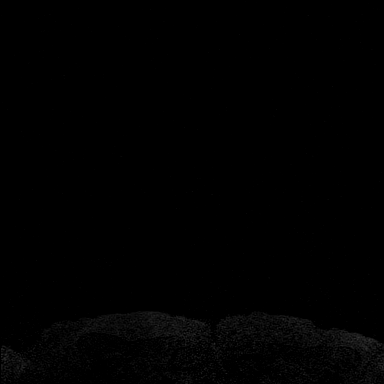

[Series 4: fl3d pre-cm · axial · non-contrast · 1.2mm · 0.78mm/px · z∈[-86,+86]mm · 5 of 144 slices shown]
[im 1/144]
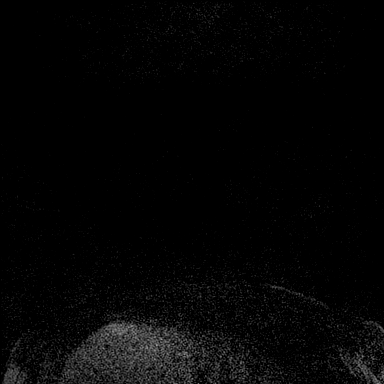
[im 36/144]
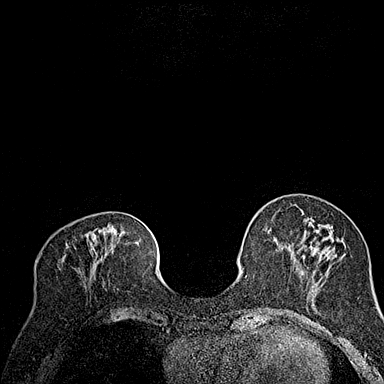
[im 72/144]
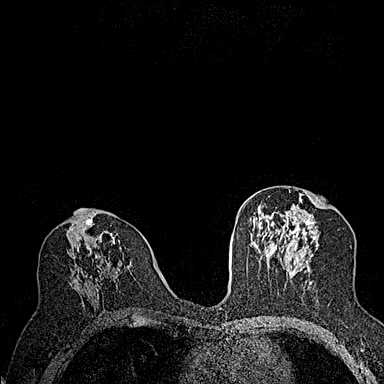
[im 108/144]
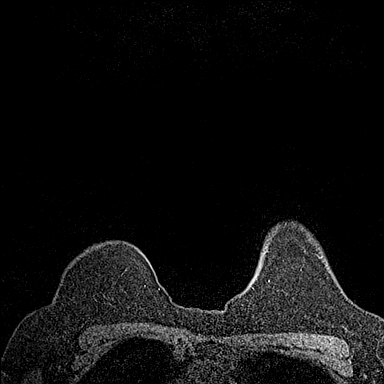
[im 144/144]
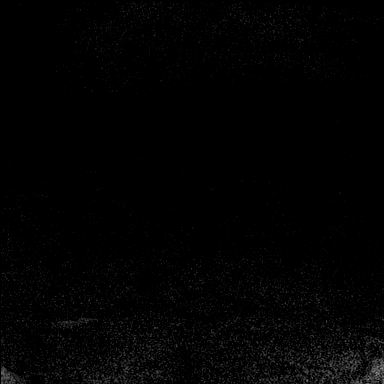

[Series 5: fl3d post-cm 20 · axial · 1.2mm · 0.78mm/px · z∈[-86,+86]mm · 5 of 144 slices shown (1 of 3)]
[im 1/144]
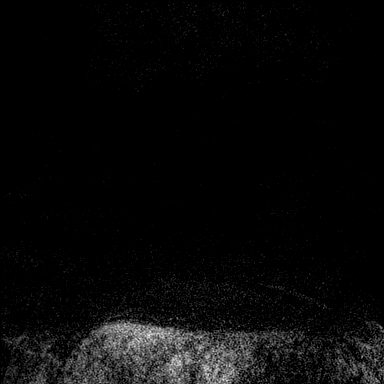
[im 36/144]
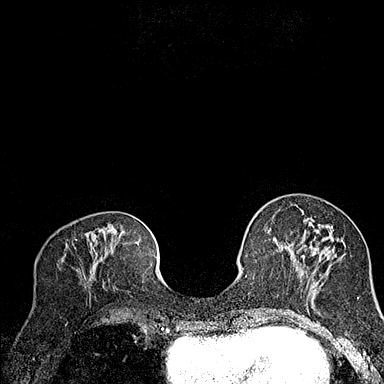
[im 72/144]
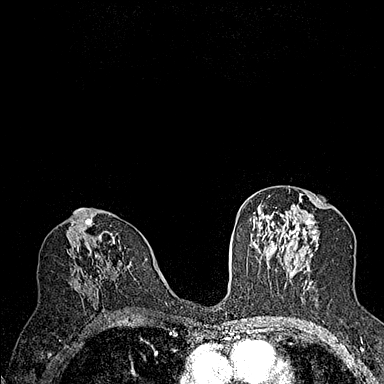
[im 108/144]
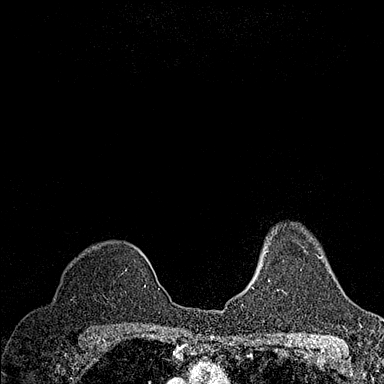
[im 144/144]
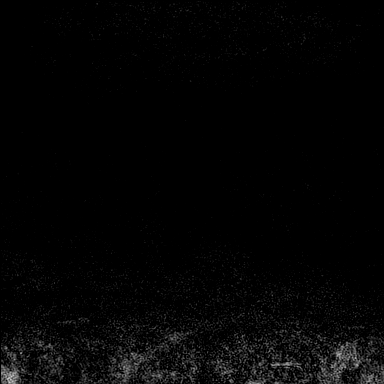

[Series 6: fl3d post-cm 20 · axial · 1.2mm · 0.78mm/px · z∈[-86,+86]mm · 5 of 144 slices shown (2 of 3)]
[im 1/144]
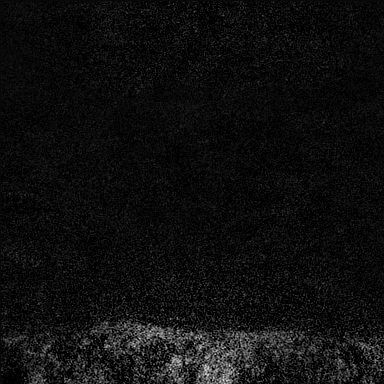
[im 36/144]
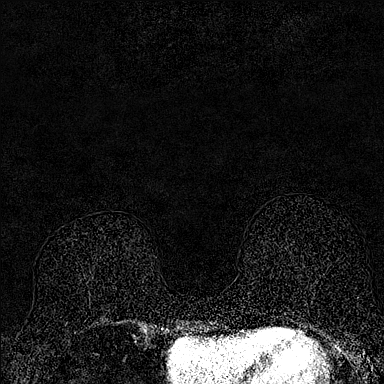
[im 72/144]
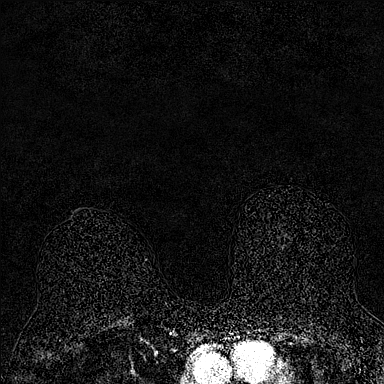
[im 108/144]
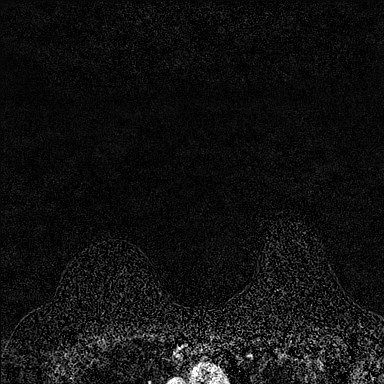
[im 144/144]
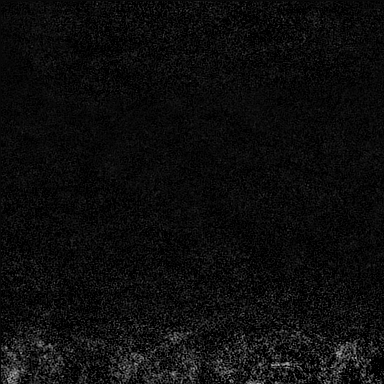

[Series 7: fl3d post-cm 20 · axial · 172.8mm · 0.78mm/px · 1 of 1 slices shown (3 of 3)]
[im 1/1]
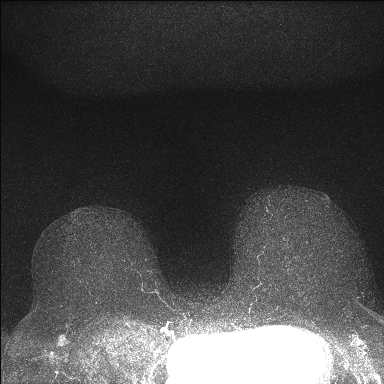

[Series 8: fl3d post-cm 3 · axial · 1.2mm · 0.78mm/px · z∈[-86,+86]mm · 6 of 144 slices shown (1 of 2)]
[im 1/144]
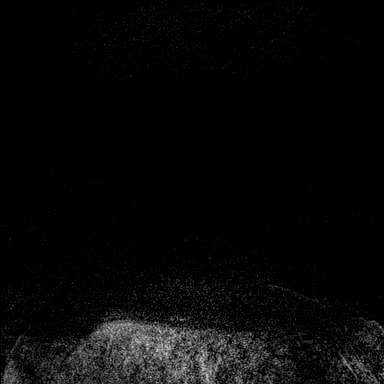
[im 29/144]
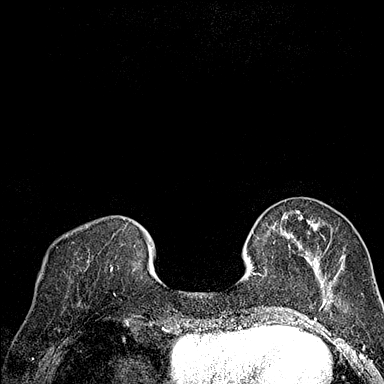
[im 58/144]
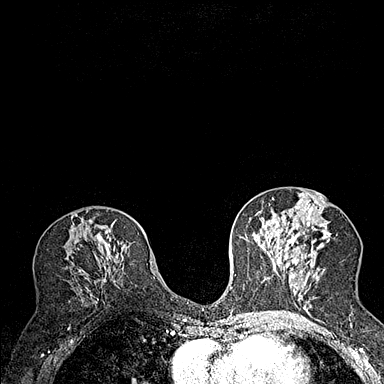
[im 86/144]
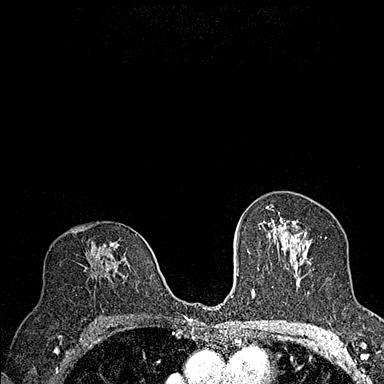
[im 115/144]
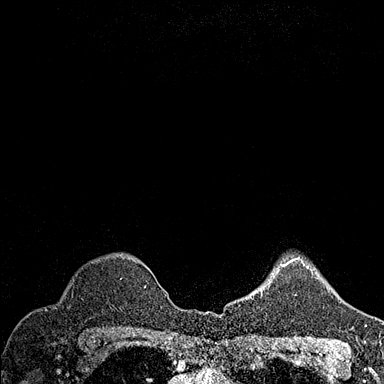
[im 144/144]
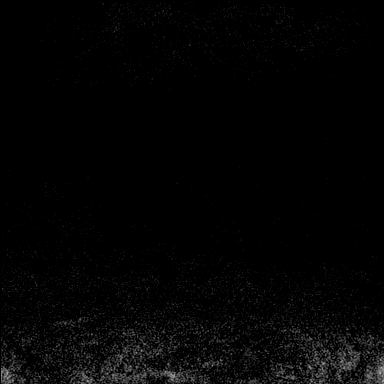

[Series 9: fl3d post-cm 3 · axial · 1.2mm · 0.78mm/px · z∈[-86,+51]mm · 5 of 144 slices shown (2 of 2)]
[im 1/144]
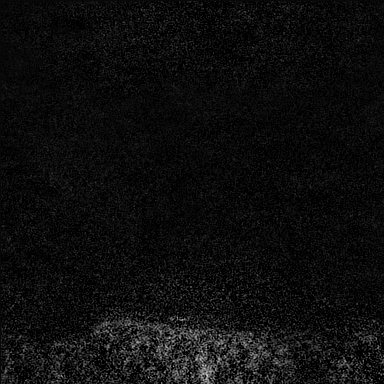
[im 29/144]
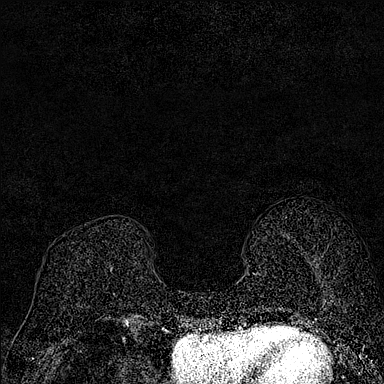
[im 58/144]
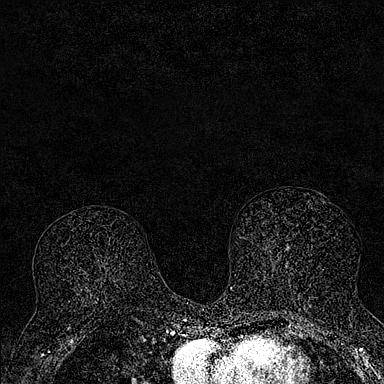
[im 86/144]
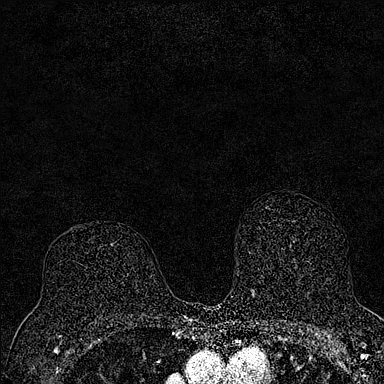
[im 115/144]
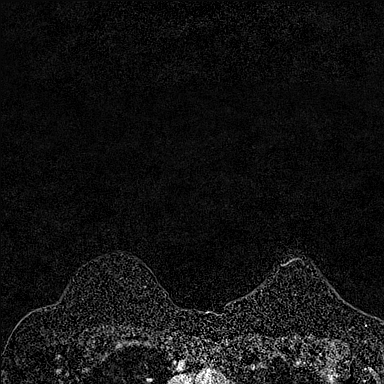

[33 of 48 positions shown; findings below may reference images not displayed]

Three-dimensional MR images were rendered by post-processing of the
original MR data on an independent workstation. The
three-dimensional MR images were interpreted, and findings are
reported in the following complete MRI report for this study. Three
dimensional images were evaluated at the independent interpreting
workstation using the DynaCAD thin client.
FINDINGS: Breast composition: c. Heterogeneous fibroglandular tissue.

Background parenchymal enhancement: Mild

Right breast: No mass or abnormal enhancement. Postsurgical scarring
in the upper breast. Post biopsy clip susceptibility artifact in the
posterior, central breast. These findings are stable.

Left breast: No mass or abnormal enhancement.

Lymph nodes: No abnormal appearing lymph nodes.

Ancillary findings:  None.
IMPRESSION: 1. No evidence of breast malignancy.
2. Benign postsurgical scarring in the upper right breast.

RECOMMENDATION:
1. Diagnostic mammography in February 2023 to provide 2 years of
stability for the probably benign right breast calcifications.

BI-RADS CATEGORY  2: Benign.

## 2024-05-10 ENCOUNTER — Ambulatory Visit (INDEPENDENT_AMBULATORY_CARE_PROVIDER_SITE_OTHER): Payer: Self-pay

## 2024-05-10 DIAGNOSIS — J309 Allergic rhinitis, unspecified: Secondary | ICD-10-CM | POA: Diagnosis not present

## 2024-05-22 DIAGNOSIS — J302 Other seasonal allergic rhinitis: Secondary | ICD-10-CM | POA: Diagnosis not present

## 2024-05-22 NOTE — Progress Notes (Signed)
 VIALS MADE 05-22-24

## 2024-05-23 DIAGNOSIS — J3089 Other allergic rhinitis: Secondary | ICD-10-CM | POA: Diagnosis not present

## 2024-08-04 ENCOUNTER — Other Ambulatory Visit: Payer: Self-pay | Admitting: Family Medicine

## 2024-08-31 ENCOUNTER — Other Ambulatory Visit: Payer: Self-pay | Admitting: Family

## 2024-08-31 DIAGNOSIS — Z9189 Other specified personal risk factors, not elsewhere classified: Secondary | ICD-10-CM

## 2024-10-06 ENCOUNTER — Ambulatory Visit
Admission: RE | Admit: 2024-10-06 | Discharge: 2024-10-06 | Disposition: A | Source: Ambulatory Visit | Attending: Family | Admitting: Family

## 2024-10-06 DIAGNOSIS — Z9189 Other specified personal risk factors, not elsewhere classified: Secondary | ICD-10-CM

## 2024-10-06 MED ORDER — GADOPICLENOL 0.5 MMOL/ML IV SOLN
7.0000 mL | Freq: Once | INTRAVENOUS | Status: AC | PRN
  Administered 2024-10-06: 7 mL via INTRAVENOUS
